# Patient Record
Sex: Female | Born: 1990 | Hispanic: Yes | Marital: Single | Smoking: Never smoker
Health system: Southern US, Community
[De-identification: ages and names within clinical notes are randomized; demographics above are authoritative.]

## PROBLEM LIST (undated history)

## (undated) ENCOUNTER — Inpatient Hospital Stay: Payer: Self-pay

## (undated) ENCOUNTER — Inpatient Hospital Stay (HOSPITAL_COMMUNITY): Payer: Self-pay

## (undated) DIAGNOSIS — D649 Anemia, unspecified: Secondary | ICD-10-CM

## (undated) DIAGNOSIS — Z789 Other specified health status: Secondary | ICD-10-CM

## (undated) HISTORY — DX: Anemia, unspecified: D64.9

## (undated) HISTORY — PX: NO PAST SURGERIES: SHX2092

---
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
## 2013-10-06 ENCOUNTER — Inpatient Hospital Stay: Payer: Self-pay | Admitting: Obstetrics and Gynecology

## 2013-10-06 LAB — CBC WITH DIFFERENTIAL/PLATELET
BASOS PCT: 0.2 %
Basophil #: 0 10*3/uL (ref 0.0–0.1)
Eosinophil #: 0 10*3/uL (ref 0.0–0.7)
Eosinophil %: 0.5 %
HCT: 29.1 % — AB (ref 35.0–47.0)
HGB: 8.7 g/dL — ABNORMAL LOW (ref 12.0–16.0)
LYMPHS PCT: 22.5 %
Lymphocyte #: 1.8 10*3/uL (ref 1.0–3.6)
MCH: 22.2 pg — ABNORMAL LOW (ref 26.0–34.0)
MCHC: 30.1 g/dL — ABNORMAL LOW (ref 32.0–36.0)
MCV: 74 fL — ABNORMAL LOW (ref 80–100)
MONO ABS: 0.6 x10 3/mm (ref 0.2–0.9)
MONOS PCT: 7.8 %
NEUTROS ABS: 5.7 10*3/uL (ref 1.4–6.5)
Neutrophil %: 69 %
PLATELETS: 227 10*3/uL (ref 150–440)
RBC: 3.94 10*6/uL (ref 3.80–5.20)
RDW: 17.9 % — ABNORMAL HIGH (ref 11.5–14.5)
WBC: 8.2 10*3/uL (ref 3.6–11.0)

## 2013-10-06 LAB — GC/CHLAMYDIA PROBE AMP

## 2013-11-14 ENCOUNTER — Encounter (HOSPITAL_COMMUNITY): Payer: Self-pay | Admitting: *Deleted

## 2014-05-23 NOTE — H&P (Signed)
L&D Evaluation:  History:  HPI Pt is a 24 yo G2P1001 at 40.[redacted] weeks GA with an EDC of 10/01/13 dated by a [id number] u/s who presents to L&D with reports of contractions. She reports +FM, denies lof. Her prenatal course is significant for late to care at 15.5 weeks, 36 pound weight gain, and anemia. She is A+, VI, RI, GBS positive, and recieved her TDaP 07/26/13.   Presents with contractions   Patient's Medical History No Chronic Illness   Patient's Surgical History none   Medications Pre Natal Vitamins  Iron   Allergies NKDA   Social History none   Family History Non-Contributory   ROS:  ROS All systems were reviewed.  HEENT, CNS, GI, GU, Respiratory, CV, Renal and Musculoskeletal systems were found to be normal.   Exam:  Vital Signs stable   General no apparent distress   Mental Status clear   Chest clear   Heart normal sinus rhythm   Abdomen gravid, tender with contractions   Back no CVAT   Pelvic no external lesions, 5/75/-2 upon admission   Mebranes Intact   FHT normal rate with no decels, 140's, +accels, no decels   Ucx regular, every 2-3 minutes   Skin dry, no lesions, no rashes   Lymph no lymphadenopathy   Impression:  Impression active labor, reactive NST, IUP at 3499w5d, GBS positive   Plan:  Plan EFM/NST, antibiotics for GBBS prophylaxis, labs, IV pain medication if desired, epidural when labs back, GBS abx, anticipate svd   Follow Up Appointment need to schedule. in 6 weeks   Electronic Signatures: Harnoor Kohles, AnimatorCourtney (CNM)  (Signed 24-Sep-15 09:26)  Authored: L&D Evaluation   Last Updated: 24-Sep-15 09:26 by Jannet MantisSubudhi, Hargis Vandyne (CNM)

## 2015-07-07 ENCOUNTER — Emergency Department (HOSPITAL_COMMUNITY)
Admission: EM | Admit: 2015-07-07 | Discharge: 2015-07-07 | Disposition: A | Discharge status: Home / Self Care | Payer: Self-pay | Attending: Emergency Medicine | Admitting: Emergency Medicine

## 2015-07-07 ENCOUNTER — Encounter (HOSPITAL_COMMUNITY): Payer: Self-pay | Admitting: Emergency Medicine

## 2015-07-07 DIAGNOSIS — T148XXA Other injury of unspecified body region, initial encounter: Secondary | ICD-10-CM

## 2015-07-07 DIAGNOSIS — S71111A Laceration without foreign body, right thigh, initial encounter: Secondary | ICD-10-CM | POA: Insufficient documentation

## 2015-07-07 DIAGNOSIS — Y999 Unspecified external cause status: Secondary | ICD-10-CM | POA: Insufficient documentation

## 2015-07-07 DIAGNOSIS — Y92512 Supermarket, store or market as the place of occurrence of the external cause: Secondary | ICD-10-CM | POA: Insufficient documentation

## 2015-07-07 DIAGNOSIS — Y9389 Activity, other specified: Secondary | ICD-10-CM | POA: Insufficient documentation

## 2015-07-07 DIAGNOSIS — W228XXA Striking against or struck by other objects, initial encounter: Secondary | ICD-10-CM | POA: Insufficient documentation

## 2015-07-07 NOTE — ED Notes (Signed)
Pt. Stated, I was trying on a dress and a piece of metal was sticking out and cut my upper rt. Leg.

## 2015-07-07 NOTE — ED Provider Notes (Signed)
CSN: [id number]     Arrival date & time 07/07/15  1129 History   First MD Initiated Contact with Patient 07/07/15 1237     Chief Complaint  Patient presents with  . Extremity Laceration    (Consider location/radiation/quality/duration/timing/severity/associated sxs/prior Treatment) The history is provided by the patient and medical records. No language interpreter was used.     Kimberly Salinas is a 25 y.o. female  who presents to the Emergency Department complaining of laceration to right upper leg causing mild 2/10 pain. [facility] and a piece of metal was stuck to it, cutting her leg. Last tetanus shot approx. 2 years ago. Denies any associated symptoms. Denies any prior treatment.    Past Medical History  Diagnosis Date  . No pertinent past medical history    Past Surgical History  Procedure Laterality Date  . No past surgeries     Family History  Problem Relation Age of Onset  . Anesthesia problems Neg Hx   . Hypotension Neg Hx   . Malignant hyperthermia Neg Hx   . Pseudochol deficiency Neg Hx   . Diabetes Paternal Aunt   . Diabetes Paternal Uncle    Social History  Substance Use Topics  . Smoking status: Never Smoker   . Smokeless tobacco: None  . Alcohol Use: No   OB History    Gravida Para Term Preterm AB TAB SAB Ectopic Multiple Living   1 1 1       1      Review of Systems  Constitutional: Negative for fever.  Musculoskeletal: Positive for myalgias. Negative for arthralgias.  Skin: Positive for wound.      Allergies  Review of patient's allergies indicates no known allergies.  Home Medications   Prior to Admission medications   Medication Sig Start Date End Date Taking? Authorizing Provider  ibuprofen (ADVIL,MOTRIN) 600 MG tablet Take 1 tablet (600 mg total) by mouth every 6 (six) hours. 04/14/11 04/24/11  Brock Badharles A Harper, MD   BP 112/69 mmHg  Pulse 85  Temp(Src) 97.9 F (36.6 C) (Oral)   Resp 18  SpO2 98%  LMP 06/30/2015 Physical Exam  Constitutional: She is oriented to person, place, and time. She appears well-developed and well-nourished.  Alert and in no acute distress  HENT:  Head: Normocephalic and atraumatic.  Cardiovascular: Normal rate, regular rhythm, normal heart sounds and intact distal pulses.   Pulmonary/Chest: Effort normal and breath sounds normal. No respiratory distress.  Musculoskeletal: She exhibits no edema.  Neurological: She is alert and oriented to person, place, and time.  Skin: Skin is warm and dry.  3.5 cm superficial laceration of right upper thigh with no surrounding erythema.   Psychiatric: She has a normal mood and affect.  Nursing note and vitals reviewed.   ED Course  Procedures (including critical care time)  LACERATION REPAIR Performed by: Chase PicketJaime Pilcher Cayleigh Paull Authorized by: Chase PicketJaime Pilcher Zenola Dezarn Consent: Verbal consent obtained. Risks and benefits: risks, benefits and alternatives were discussed Consent given by: patient Patient identity confirmed: provided demographic data Prepped and Draped in normal sterile fashion Wound explored Laceration Location: right thigh Laceration Length: 3.5 cm No Foreign Bodies seen or palpated Anesthesia: none Irrigation method: syringe Amount of cleaning: standard Skin closure: steri-strips Number: 6 Patient tolerance: Patient tolerated the procedure well with no immediate complications.  Labs Review Labs Reviewed - No data to display  Imaging Review No results found. I have personally reviewed and  evaluated these images and lab results as part of my medical decision-making.   EKG Interpretation None      MDM   Final diagnoses:  Superficial laceration   Kimberly Salinas presents to ED for superficial laceration of right thigh. Laceration repaired with steri-strips as dictated above. Patient counseled on home wound care. Follow up with PCP/urgent care PRN. Patient was urged to  return to the Emergency Department for worsening pain, swelling, expanding erythema especially if it streaks away from the affected area, fever, or for any additional concerns. Patient verbalized understanding. All questions answered.   [facility] CenterJaime Pilcher Baird Polinski, PA-C 07/07/15 1309  Nelva Nayobert Beaton, MD 07/08/15 [phone]

## 2015-07-07 NOTE — ED Notes (Signed)
PT ambulated with baseline gait; VSS; A&Ox3; no signs of distress; respirations even and unlabored; skin warm and dry; no questions upon discharge.  

## 2015-07-07 NOTE — Discharge Instructions (Signed)
Keep area covered with bandage, keep bandage dry, and do not submerge in water for 24 hours. Ice and elevate for additional pain relief and swelling. Alternate between ibuprofen and Tylenol for additional pain relief. Monitor area for signs of infection to include, but not limited to: increasing pain, spreading redness, drainage/pus, worsening swelling, or fevers. Return to emergency department for emergent changing or worsening symptoms.   WOUND CARE  Keep area clean and dry for 24 hours. Do not remove bandage, if applied.  After 24 hours,you should change it at least once a day. Also, change the dressing if it becomes wet or dirty, or as directed by your caregiver.   Return if you experience any of the following signs of infection: Swelling, redness, pus drainage, streaking, fever >101.0 F  Return if you experience excessive bleeding that does not stop after 15-20 minutes of constant, firm pressure.

## 2017-01-13 NOTE — L&D Delivery Note (Signed)
Delivery Note At  [phone] a viable and healthy female was delivered via  (Presentation:LOA ;  ).  APGAR:9 ,9  .   Placenta status:delivered intact with 3 vessel  Cord:  with the following complications: true knot in cord  Anesthesia:  epidural Episiotomy:  none Lacerations:  none Suture Repair: NA Est. Blood Loss (mL):    Mom to postpartum.  Baby to Couplet care / Skin to Skin.  Randol Zumstein N Reagyn Facemire 11/30/2017, 9:36 AM

## 2017-06-01 ENCOUNTER — Encounter: Payer: Self-pay | Admitting: Obstetrics and Gynecology

## 2017-06-05 ENCOUNTER — Encounter: Payer: Self-pay | Admitting: Certified Nurse Midwife

## 2017-06-05 ENCOUNTER — Other Ambulatory Visit (HOSPITAL_COMMUNITY)
Admission: RE | Admit: 2017-06-05 | Discharge: 2017-06-05 | Disposition: A | Discharge status: Home / Self Care | Payer: Medicaid Other | Source: Ambulatory Visit | Attending: Certified Nurse Midwife | Admitting: Certified Nurse Midwife

## 2017-06-05 ENCOUNTER — Ambulatory Visit (INDEPENDENT_AMBULATORY_CARE_PROVIDER_SITE_OTHER): Payer: Medicaid Other | Admitting: Certified Nurse Midwife

## 2017-06-05 VITALS — BP 129/75 | HR 97 | Wt 162.0 lb

## 2017-06-05 DIAGNOSIS — Z3482 Encounter for supervision of other normal pregnancy, second trimester: Secondary | ICD-10-CM

## 2017-06-05 DIAGNOSIS — Z3480 Encounter for supervision of other normal pregnancy, unspecified trimester: Secondary | ICD-10-CM | POA: Diagnosis not present

## 2017-06-05 MED ORDER — VITAFOL GUMMIES 3.33-0.333-34.8 MG PO CHEW: 3.0000 | CHEWABLE_TABLET | Freq: Every day | ORAL | 12 refills | Status: DC

## 2017-06-05 NOTE — Progress Notes (Signed)
Subjective:   Amiliah Seville Brick is a 27 y.o. [id number] at [redacted]w[redacted]d by LMP being seen today for her first obstetrical visit.  Her obstetrical history is significant for none. Patient does intend to pump for several weeks/ intend to breast feed. Pregnancy history fully reviewed.  Not currently employed.  Patient reports no complaints.  HISTORY: OB History  Gravida Para Term Preterm AB Living  0 0 2  SAB TAB Ectopic Multiple Live Births  0 0 0 0 2    # Outcome Date GA Lbr Len/2nd Weight Sex Delivery Anes PTL Lv  3 Current           2 Term 10/06/12    M Vag-Spont   LIV  1 Term 04/12/11 [redacted]w[redacted]d 28:45 / 03:00 8 lb 2.5 oz (3.7 kg) F Vag-Spont EPI  LIV     Name: Beaulac,GIRL Kiarah     Apgar1: 8  Apgar5: 9    Last pap smear was done unknown  Past Medical History:  Diagnosis Date  . No pertinent past medical history    Past Surgical History:  Procedure Laterality Date  . NO PAST SURGERIES     Family History  Problem Relation Age of Onset  . Diabetes Paternal Aunt   . Diabetes Paternal Uncle   . Anesthesia problems Neg Hx   . Hypotension Neg Hx   . Malignant hyperthermia Neg Hx   . Pseudochol deficiency Neg Hx    Social History   Tobacco Use  . Smoking status: Never Smoker  . Smokeless tobacco: Never Used  Substance Use Topics  . Alcohol use: No  . Drug use: No   No Known Allergies No current outpatient medications on file prior to visit.   No current facility-administered medications on file prior to visit.     Review of Systems Pertinent items noted in HPI and remainder of comprehensive ROS otherwise negative.  Exam   Vitals:   06/05/17 0917  BP: 129/75  Pulse: 97  Weight: 162 lb (73.5 kg)   Fetal Heart Rate (bpm): 160; doppler  Uterus:  Fundal Height: 14 cm  Pelvic Exam: Perineum: no hemorrhoids, normal perineum   Vulva: normal external genitalia, no lesions   Vagina:  normal mucosa, normal discharge   Cervix: no lesions and normal, pap smear  done, friable    Adnexa: normal adnexa and no mass, fullness, tenderness   Bony Pelvis: average  System: General: well-developed, well-nourished female in no acute distress   Breast:  normal appearance, no masses or tenderness   Skin: normal coloration and turgor, no rashes   Neurologic: oriented, normal, negative, normal mood   Extremities: normal strength, tone, and muscle mass, ROM of all joints is normal   HEENT PERRLA, extraocular movement intact and sclera clear, anicteric   Mouth/Teeth mucous membranes moist, pharynx normal without lesions and dental hygiene good   Neck supple and no masses   Cardiovascular: regular rate and rhythm   Respiratory:  no respiratory distress, normal breath sounds   Abdomen: soft, non-tender; bowel sounds normal; no masses,  no organomegaly     Assessment:   Pregnancy: A5W0981 Patient Active Problem List   Diagnosis Date Noted  . Supervision of other normal pregnancy, antepartum 06/05/2017     Plan:  1. Supervision of other normal pregnancy, antepartum     - Cytology - PAP - VITAMIN D 25 Hydroxy (Vit-D Deficiency, Fractures) - Culture, OB Urine - Obstetric Panel, Including HIV - Hemoglobin  A1c - Hemoglobinopathy evaluation - Inheritest Core(CF97,SMA,FraX) - Cervicovaginal ancillary only - Prenatal Vit-Fe Phos-FA-Omega (VITAFOL GUMMIES) 3.33-0.333-34.8 MG CHEW; Chew 3 tablets by mouth at bedtime.  Dispense: 90 tablet; Refill: 12 - [place] MFM OB COMP + 14 WK; Future   Initial labs drawn. Continue prenatal vitamins. Genetic Screening discussed, NIPS: ordered. Ultrasound discussed; fetal anatomic survey: ordered. Problem list reviewed and updated. The nature of [facility] with multiple MDs and other Advanced Practice Providers was explained to patient; also emphasized that residents, students are part of our team. Routine obstetric precautions reviewed. Return in about 1 month (around 07/03/2017) for  ROB.     Orvilla Cornwall, CNM [facility], [facility]

## 2017-06-05 NOTE — Patient Instructions (Signed)
Second Trimester of Pregnancy The second trimester is from week 13 through week 28, month 4 through 6. This is often the time in pregnancy that you feel your best. Often times, morning sickness has lessened or quit. You may have more energy, and you may get hungry more often. Your unborn baby (fetus) is growing rapidly. At the end of the sixth month, he or she is about 9 inches long and weighs about 1 pounds. You will likely feel the baby move (quickening) between 18 and 20 weeks of pregnancy. Follow these instructions at home:  Avoid all smoking, herbs, and alcohol. Avoid drugs not approved by your doctor.  Do not use any tobacco products, including cigarettes, chewing tobacco, and electronic cigarettes. If you need help quitting, ask your doctor. You may get counseling or other support to help you quit.  Only take medicine as told by your doctor. Some medicines are safe and some are not during pregnancy.  Exercise only as told by your doctor. Stop exercising if you start having cramps.  Eat regular, healthy meals.  Wear a good support bra if your breasts are tender.  Do not use hot tubs, steam rooms, or saunas.  Wear your seat belt when driving.  Avoid raw meat, uncooked cheese, and liter boxes and soil used by cats.  Take your prenatal vitamins.  Take 1500-2000 milligrams of calcium daily starting at the 20th week of pregnancy until you deliver your baby.  Try taking medicine that helps you poop (stool softener) as needed, and if your doctor approves. Eat more fiber by eating fresh fruit, vegetables, and whole grains. Drink enough fluids to keep your pee (urine) clear or pale yellow.  Take warm water baths (sitz baths) to soothe pain or discomfort caused by hemorrhoids. Use hemorrhoid cream if your doctor approves.  If you have puffy, bulging veins (varicose veins), wear support hose. Raise (elevate) your feet for 15 minutes, 3-4 times a day. Limit salt in your diet.  Avoid heavy  lifting, wear low heals, and sit up straight.  Rest with your legs raised if you have leg cramps or low back pain.  Visit your dentist if you have not gone during your pregnancy. Use a soft toothbrush to brush your teeth. Be gentle when you floss.  You can have sex (intercourse) unless your doctor tells you not to.  Go to your doctor visits. Get help if:  You feel dizzy.  You have mild cramps or pressure in your lower belly (abdomen).  You have a nagging pain in your belly area.  You continue to feel sick to your stomach (nauseous), throw up (vomit), or have watery poop (diarrhea).  You have bad smelling fluid coming from your vagina.  You have pain with peeing (urination). Get help right away if:  You have a fever.  You are leaking fluid from your vagina.  You have spotting or bleeding from your vagina.  You have severe belly cramping or pain.  You lose or gain weight rapidly.  You have trouble catching your breath and have chest pain.  You notice sudden or extreme puffiness (swelling) of your face, hands, ankles, feet, or legs.  You have not felt the baby move in over an hour.  You have severe headaches that do not go away with medicine.  You have vision changes. This information is not intended to replace advice given to you by your health care provider. Make sure you discuss any questions you have with your health care   provider. Document Released: 03/26/2009 Document Revised: 06/07/2015 Document Reviewed: 03/02/2012 Elsevier Interactive Patient Education  2017 Elsevier Inc.  

## 2017-06-09 ENCOUNTER — Other Ambulatory Visit: Payer: Self-pay | Admitting: Certified Nurse Midwife

## 2017-06-09 DIAGNOSIS — O2342 Unspecified infection of urinary tract in pregnancy, second trimester: Secondary | ICD-10-CM

## 2017-06-09 LAB — CULTURE, OB URINE

## 2017-06-09 LAB — CERVICOVAGINAL ANCILLARY ONLY
CHLAMYDIA, DNA PROBE: NEGATIVE
NEISSERIA GONORRHEA: NEGATIVE

## 2017-06-09 LAB — URINE CULTURE, OB REFLEX

## 2017-06-09 MED ORDER — CEFIXIME 400 MG PO CAPS: 400.0000 mg | ORAL_CAPSULE | Freq: Every day | ORAL | 0 refills | Status: DC

## 2017-06-10 LAB — OBSTETRIC PANEL, INCLUDING HIV
ANTIBODY SCREEN: NEGATIVE
BASOS: 0 %
Basophils Absolute: 0 10*3/uL (ref 0.0–0.2)
EOS (ABSOLUTE): 0.1 10*3/uL (ref 0.0–0.4)
EOS: 1 %
HEMATOCRIT: 34.8 % (ref 34.0–46.6)
HEMOGLOBIN: 11.7 g/dL (ref 11.1–15.9)
HIV SCREEN 4TH GENERATION: NONREACTIVE
Hepatitis B Surface Ag: NEGATIVE
Immature Grans (Abs): 0 10*3/uL (ref 0.0–0.1)
Immature Granulocytes: 0 %
LYMPHS ABS: 1.3 10*3/uL (ref 0.7–3.1)
LYMPHS: 20 %
MCH: 29 pg (ref 26.6–33.0)
MCHC: 33.6 g/dL (ref 31.5–35.7)
MCV: 86 fL (ref 79–97)
MONOS ABS: 0.4 10*3/uL (ref 0.1–0.9)
Monocytes: 5 %
Neutrophils Absolute: 4.9 10*3/uL (ref 1.4–7.0)
Neutrophils: 74 %
Platelets: 221 10*3/uL (ref 150–450)
RBC: 4.04 x10E6/uL (ref 3.77–5.28)
RDW: 15.3 % (ref 12.3–15.4)
RH TYPE: POSITIVE
RPR Ser Ql: NONREACTIVE
Rubella Antibodies, IGG: 2.02 index (ref >0.99)
WBC: 6.7 10*3/uL (ref 3.4–10.8)

## 2017-06-10 LAB — HEMOGLOBINOPATHY EVALUATION
HEMOGLOBIN A2 QUANTITATION: 2.1 % (ref 1.8–3.2)
HGB C: 0 %
HGB S: 0 %
HGB VARIANT: 0 %
Hemoglobin F Quantitation: 0 % (ref 0.0–2.0)
Hgb A: 97.9 % (ref 96.4–98.8)

## 2017-06-10 LAB — VITAMIN D 25 HYDROXY (VIT D DEFICIENCY, FRACTURES): Vit D, 25-Hydroxy: 10.9 ng/mL — ABNORMAL LOW (ref 30.0–100.0)

## 2017-06-10 LAB — HEMOGLOBIN A1C
Est. average glucose Bld gHb Est-mCnc: 111 mg/dL
HEMOGLOBIN A1C: 5.5 % (ref 4.8–5.6)

## 2017-06-10 LAB — CYTOLOGY - PAP: Diagnosis: NEGATIVE

## 2017-06-11 ENCOUNTER — Other Ambulatory Visit: Payer: Self-pay | Admitting: Certified Nurse Midwife

## 2017-06-11 DIAGNOSIS — Z3480 Encounter for supervision of other normal pregnancy, unspecified trimester: Secondary | ICD-10-CM

## 2017-06-11 DIAGNOSIS — E559 Vitamin D deficiency, unspecified: Secondary | ICD-10-CM

## 2017-06-11 MED ORDER — VITAMIN D (ERGOCALCIFEROL) 1.25 MG (50000 UNIT) PO CAPS: 50000.0000 [IU] | ORAL_CAPSULE | ORAL | 2 refills | Status: DC

## 2017-06-17 LAB — INHERITEST CORE(CF97,SMA,FRAX)

## 2017-06-18 ENCOUNTER — Encounter: Payer: Self-pay | Admitting: *Deleted

## 2017-06-19 ENCOUNTER — Other Ambulatory Visit: Payer: Self-pay | Admitting: Certified Nurse Midwife

## 2017-06-19 ENCOUNTER — Encounter: Payer: Self-pay | Admitting: *Deleted

## 2017-06-19 DIAGNOSIS — Z3480 Encounter for supervision of other normal pregnancy, unspecified trimester: Secondary | ICD-10-CM

## 2017-06-22 ENCOUNTER — Encounter (HOSPITAL_COMMUNITY): Payer: Self-pay

## 2017-06-29 ENCOUNTER — Ambulatory Visit (HOSPITAL_COMMUNITY)
Admission: RE | Admit: 2017-06-29 | Discharge: 2017-06-29 | Disposition: A | Discharge status: Home / Self Care | Payer: Medicaid Other | Source: Ambulatory Visit | Attending: Certified Nurse Midwife | Admitting: Certified Nurse Midwife

## 2017-06-29 DIAGNOSIS — Z3482 Encounter for supervision of other normal pregnancy, second trimester: Secondary | ICD-10-CM | POA: Diagnosis not present

## 2017-06-29 DIAGNOSIS — Z3A18 18 weeks gestation of pregnancy: Secondary | ICD-10-CM | POA: Insufficient documentation

## 2017-06-29 DIAGNOSIS — Z3480 Encounter for supervision of other normal pregnancy, unspecified trimester: Secondary | ICD-10-CM

## 2017-07-03 ENCOUNTER — Ambulatory Visit (INDEPENDENT_AMBULATORY_CARE_PROVIDER_SITE_OTHER): Payer: Medicaid Other | Admitting: Certified Nurse Midwife

## 2017-07-03 ENCOUNTER — Other Ambulatory Visit: Payer: Self-pay | Admitting: Certified Nurse Midwife

## 2017-07-03 VITALS — BP 107/65 | HR 76 | Wt 167.0 lb

## 2017-07-03 DIAGNOSIS — Z3482 Encounter for supervision of other normal pregnancy, second trimester: Secondary | ICD-10-CM

## 2017-07-03 DIAGNOSIS — Z3480 Encounter for supervision of other normal pregnancy, unspecified trimester: Secondary | ICD-10-CM

## 2017-07-03 DIAGNOSIS — E559 Vitamin D deficiency, unspecified: Secondary | ICD-10-CM

## 2017-07-03 DIAGNOSIS — O2342 Unspecified infection of urinary tract in pregnancy, second trimester: Secondary | ICD-10-CM

## 2017-07-03 NOTE — Progress Notes (Signed)
   PRENATAL VISIT NOTE  Subjective:  Kimberly Salinas is a 27 y.o. [id number] at 1848w5d being seen today for ongoing prenatal care.  She is currently monitored for the following issues for this low-risk pregnancy and has Supervision of other normal pregnancy, antepartum; UTI (urinary tract infection) during pregnancy, second trimester; and Vitamin D deficiency on their problem list.  Patient reports no complaints.  Contractions: Not present. Vag. Bleeding: None.  Movement: Present. Denies leaking of fluid.   The following portions of the patient's history were reviewed and updated as appropriate: allergies, current medications, past family history, past medical history, past social history, past surgical history and problem list. Problem list updated.  Objective:   Vitals:   07/03/17 0923  BP: 107/65  Pulse: 76  Weight: 167 lb (75.8 kg)    Fetal Status: Fetal Heart Rate (bpm): 154; US   Movement: Present     General:  Alert, oriented and cooperative. Patient is in no acute distress.  Skin: Skin is warm and dry. No rash noted.   Cardiovascular: Normal heart rate noted  Respiratory: Normal respiratory effort, no problems with respiration noted  Abdomen: Soft, gravid, appropriate for gestational age.  Pain/Pressure: Absent     Pelvic: Cervical exam deferred        Extremities: Normal range of motion.     Mental Status: Normal mood and affect. Normal behavior. Normal judgment and thought content.   Assessment and Plan:  Pregnancy: G3P2002 at 5748w5d  1. Supervision of other normal pregnancy, antepartum     Doing well.  F/U US ordered.  - AFP, Serum, Open Spina Bifida  2. Vitamin D deficiency     Taking weekly vitamin D  3. UTI (urinary tract infection) during pregnancy, second trimester     TOC next ROB  Preterm labor symptoms and general obstetric precautions including but not limited to vaginal bleeding, contractions, leaking of fluid and fetal movement were reviewed in detail  with the patient. Please refer to After Visit Summary for other counseling recommendations.  Return in about 1 month (around 07/31/2017) for ROB.  Future Appointments  Date Time Provider Department Center  08/04/2017  1:00 PM Roe CoombsDenney, Rachelle A, CNM [facility] None  08/17/2017 10:15 AM [facility] US 4 [facility]    Roe Coombsachelle A Denney, CNM

## 2017-07-08 ENCOUNTER — Other Ambulatory Visit: Payer: Self-pay | Admitting: Certified Nurse Midwife

## 2017-07-08 DIAGNOSIS — Z3480 Encounter for supervision of other normal pregnancy, unspecified trimester: Secondary | ICD-10-CM

## 2017-07-08 LAB — AFP, SERUM, OPEN SPINA BIFIDA
AFP MoM: 2.26
AFP Value: 97.9 ng/mL
Gest. Age on Collection Date: 18.7 weeks
Maternal Age At EDD: 25.7 yr
OSBR Risk 1 IN: 462
Test Results:: NEGATIVE
WEIGHT: 167 [lb_av]

## 2017-08-04 ENCOUNTER — Encounter (HOSPITAL_COMMUNITY): Payer: Self-pay | Admitting: *Deleted

## 2017-08-04 ENCOUNTER — Encounter: Payer: Self-pay | Admitting: Obstetrics

## 2017-08-04 ENCOUNTER — Ambulatory Visit (INDEPENDENT_AMBULATORY_CARE_PROVIDER_SITE_OTHER): Payer: Medicaid Other | Admitting: Obstetrics

## 2017-08-04 ENCOUNTER — Inpatient Hospital Stay (HOSPITAL_COMMUNITY)
Admission: AD | Admit: 2017-08-04 | Discharge: 2017-08-04 | Disposition: A | Discharge status: Home / Self Care | Payer: Medicaid Other | Source: Ambulatory Visit | Attending: Obstetrics and Gynecology | Admitting: Obstetrics and Gynecology

## 2017-08-04 ENCOUNTER — Other Ambulatory Visit: Payer: Self-pay

## 2017-08-04 DIAGNOSIS — T1490XA Injury, unspecified, initial encounter: Secondary | ICD-10-CM

## 2017-08-04 DIAGNOSIS — Z041 Encounter for examination and observation following transport accident: Secondary | ICD-10-CM | POA: Diagnosis not present

## 2017-08-04 DIAGNOSIS — Z3480 Encounter for supervision of other normal pregnancy, unspecified trimester: Secondary | ICD-10-CM

## 2017-08-04 DIAGNOSIS — Z3A23 23 weeks gestation of pregnancy: Secondary | ICD-10-CM

## 2017-08-04 DIAGNOSIS — Z79899 Other long term (current) drug therapy: Secondary | ICD-10-CM | POA: Insufficient documentation

## 2017-08-04 DIAGNOSIS — Z3482 Encounter for supervision of other normal pregnancy, second trimester: Secondary | ICD-10-CM

## 2017-08-04 DIAGNOSIS — O9A212 Injury, poisoning and certain other consequences of external causes complicating pregnancy, second trimester: Secondary | ICD-10-CM | POA: Diagnosis not present

## 2017-08-04 DIAGNOSIS — Z3689 Encounter for other specified antenatal screening: Secondary | ICD-10-CM
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
## 2017-08-04 NOTE — MAU Note (Signed)
Urine sent to lab 

## 2017-08-04 NOTE — Progress Notes (Signed)
Subjective:  Kimberly Salinas is a 27 y.o. G3P2002 at 7245w2d being seen today for ongoing prenatal care.  She is currently monitored for the following issues for this low-risk pregnancy and has Supervision of other normal pregnancy, antepartum; UTI (urinary tract infection) during pregnancy, second trimester; and Vitamin D deficiency on their problem list.  Patient reports no complaints.  Contractions: Not present. Vag. Bleeding: None.  Movement: Present. Denies leaking of fluid.   The following portions of the patient's history were reviewed and updated as appropriate: allergies, current medications, past family history, past medical history, past social history, past surgical history and problem list. Problem list updated.  Objective:   Vitals:   08/04/17 1317  BP: 101/61  Pulse: (!) 101  Weight: 164 lb 8 oz (74.6 kg)    Fetal Status: Fetal Heart Rate (bpm): 150   Movement: Present     General:  Alert, oriented and cooperative. Patient is in no acute distress.  Skin: Skin is warm and dry. No rash noted.   Cardiovascular: Normal heart rate noted  Respiratory: Normal respiratory effort, no problems with respiration noted  Abdomen: Soft, gravid, appropriate for gestational age. Pain/Pressure: Absent     Pelvic:  Cervical exam deferred        Extremities: Normal range of motion.  Edema: None  Mental Status: Normal mood and affect. Normal behavior. Normal judgment and thought content.   Urinalysis:      Assessment and Plan:  Pregnancy: G3P2002 at 3445w2d  1. Supervision of other normal pregnancy, antepartum   Preterm labor symptoms and general obstetric precautions including but not limited to vaginal bleeding, contractions, leaking of fluid and fetal movement were reviewed in detail with the patient. Please refer to After Visit Summary for other counseling recommendations.  Return in about 1 month (around 09/01/2017) for ROB, 2 hour OGTT.   Brock BadHarper, Charles A, MD

## 2017-08-04 NOTE — Progress Notes (Signed)
Patient reports good fetal movement, denies pain. 
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
## 2017-08-04 NOTE — Discharge Instructions (Signed)
Second Trimester of Pregnancy The second trimester is from week 13 through week 28, month 4 through 6. This is often the time in pregnancy that you feel your best. Often times, morning sickness has lessened or quit. You may have more energy, and you may get hungry more often. Your unborn baby (fetus) is growing rapidly. At the end of the sixth month, he or she is about 9 inches long and weighs about 1 pounds. You will likely feel the baby move (quickening) between 18 and 20 weeks of pregnancy. Follow these instructions at home:  Avoid all smoking, herbs, and alcohol. Avoid drugs not approved by your doctor.  Do not use any tobacco products, including cigarettes, chewing tobacco, and electronic cigarettes. If you need help quitting, ask your doctor. You may get counseling or other support to help you quit.  Only take medicine as told by your doctor. Some medicines are safe and some are not during pregnancy.  Exercise only as told by your doctor. Stop exercising if you start having cramps.  Eat regular, healthy meals.  Wear a good support bra if your breasts are tender.  Do not use hot tubs, steam rooms, or saunas.  Wear your seat belt when driving.  Avoid raw meat, uncooked cheese, and liter boxes and soil used by cats.  Take your prenatal vitamins.  Take 1500-2000 milligrams of calcium daily starting at the 20th week of pregnancy until you deliver your baby.  Try taking medicine that helps you poop (stool softener) as needed, and if your doctor approves. Eat more fiber by eating fresh fruit, vegetables, and whole grains. Drink enough fluids to keep your pee (urine) clear or pale yellow.  Take warm water baths (sitz baths) to soothe pain or discomfort caused by hemorrhoids. Use hemorrhoid cream if your doctor approves.  If you have puffy, bulging veins (varicose veins), wear support hose. Raise (elevate) your feet for 15 minutes, 3-4 times a day. Limit salt in your diet.  Avoid heavy  lifting, wear low heals, and sit up straight.  Rest with your legs raised if you have leg cramps or low back pain.  Visit your dentist if you have not gone during your pregnancy. Use a soft toothbrush to brush your teeth. Be gentle when you floss.  You can have sex (intercourse) unless your doctor tells you not to.  Go to your doctor visits. Get help if:  You feel dizzy.  You have mild cramps or pressure in your lower belly (abdomen).  You have a nagging pain in your belly area.  You continue to feel sick to your stomach (nauseous), throw up (vomit), or have watery poop (diarrhea).  You have bad smelling fluid coming from your vagina.  You have pain with peeing (urination). Get help right away if:  You have a fever.  You are leaking fluid from your vagina.  You have spotting or bleeding from your vagina.  You have severe belly cramping or pain.  You lose or gain weight rapidly.  You have trouble catching your breath and have chest pain.  You notice sudden or extreme puffiness (swelling) of your face, hands, ankles, feet, or legs.  You have not felt the baby move in over an hour.  You have severe headaches that do not go away with medicine.  You have vision changes. This information is not intended to replace advice given to you by your health care provider. Make sure you discuss any questions you have with your health care   provider. Document Released: 03/26/2009 Document Revised: 06/07/2015 Document Reviewed: 03/02/2012 Elsevier Interactive Patient Education  2017 Elsevier Inc.  

## 2017-08-06 ENCOUNTER — Other Ambulatory Visit: Payer: Self-pay | Admitting: Obstetrics

## 2017-08-06 ENCOUNTER — Telehealth: Payer: Self-pay

## 2017-08-06 DIAGNOSIS — Z3490 Encounter for supervision of normal pregnancy, unspecified, unspecified trimester: Secondary | ICD-10-CM

## 2017-08-06 MED ORDER — PNV-DHA+DOCUSATE 27-1.25-300 MG PO CAPS
1.0000 | ORAL_CAPSULE | Freq: Every day | ORAL | 4 refills | Status: DC
Start: 2017-08-06 — End: 2017-10-06
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
## 2017-08-06 NOTE — Telephone Encounter (Signed)
Pt requesting new PNV Rachelle recently sent PNV Gummies they are not cost effectives Please advise free PNV for pt to [facility] [place] on [address]

## 2017-08-06 NOTE — Telephone Encounter (Signed)
PNV's Rx 

## 2017-08-10 ENCOUNTER — Other Ambulatory Visit: Payer: Self-pay

## 2017-08-10 ENCOUNTER — Telehealth: Payer: Self-pay

## 2017-08-10 MED ORDER — PRENATAL GUMMIES/DHA & FA 0.4-32.5 MG PO CHEW: 1.0000 | CHEWABLE_TABLET | Freq: Once | ORAL | 12 refills | Status: AC

## 2017-08-10 NOTE — Telephone Encounter (Signed)
Returned call and pt stated that last rx for PN gummies is not covered by her insurance, advised pt that provider would send another one.

## 2017-08-17 ENCOUNTER — Other Ambulatory Visit: Payer: Self-pay | Admitting: Certified Nurse Midwife

## 2017-08-17 ENCOUNTER — Ambulatory Visit (HOSPITAL_COMMUNITY)
Admission: RE | Admit: 2017-08-17 | Discharge: 2017-08-17 | Disposition: A | Discharge status: Home / Self Care | Payer: Medicaid Other | Source: Ambulatory Visit | Attending: Internal Medicine | Admitting: Internal Medicine

## 2017-08-17 ENCOUNTER — Other Ambulatory Visit: Payer: Self-pay | Admitting: Obstetrics

## 2017-08-17 DIAGNOSIS — Z362 Encounter for other antenatal screening follow-up: Secondary | ICD-10-CM | POA: Insufficient documentation

## 2017-08-17 DIAGNOSIS — Z3A25 25 weeks gestation of pregnancy: Secondary | ICD-10-CM

## 2017-08-17 DIAGNOSIS — Z3480 Encounter for supervision of other normal pregnancy, unspecified trimester: Secondary | ICD-10-CM

## 2017-08-17 DIAGNOSIS — Z3482 Encounter for supervision of other normal pregnancy, second trimester: Secondary | ICD-10-CM | POA: Diagnosis not present

## 2017-09-04 ENCOUNTER — Ambulatory Visit (INDEPENDENT_AMBULATORY_CARE_PROVIDER_SITE_OTHER): Payer: Medicaid Other | Admitting: Student

## 2017-09-04 ENCOUNTER — Encounter: Payer: Self-pay | Admitting: Obstetrics and Gynecology

## 2017-09-04 VITALS — BP 103/64 | HR 80 | Wt 169.6 lb

## 2017-09-04 DIAGNOSIS — Z3480 Encounter for supervision of other normal pregnancy, unspecified trimester: Secondary | ICD-10-CM

## 2017-09-04 MED ORDER — PREPLUS 27-1 MG PO TABS: 1.0000 | ORAL_TABLET | Freq: Every day | ORAL | 13 refills | Status: DC

## 2017-09-04 NOTE — Progress Notes (Signed)
Patient ID: Kimberly Salinas, female   DOB: 1990/12/29, 27 y.o.   MRN: [id number]   PRENATAL VISIT NOTE  Subjective:  Kimberly Salinas is a 27 y.o. G3P2002 at 5568w5d being seen today for ongoing prenatal care.  She is currently monitored for the following issues for this low-risk pregnancy and has Supervision of other normal pregnancy, antepartum; UTI (urinary tract infection) during pregnancy, second trimester; and Vitamin D deficiency on their problem list.  Patient reports no complaints.  Contractions: Not present. Vag. Bleeding: None.  Movement: Present. Denies leaking of fluid.   The following portions of the patient's history were reviewed and updated as appropriate: allergies, current medications, past family history, past medical history, past social history, past surgical history and problem list. Problem list updated.  Objective:   Vitals:   09/04/17 0857  BP: 103/64  Pulse: 80  Weight: 169 lb 9.6 oz (76.9 kg)    Fetal Status: Fetal Heart Rate (bpm): 138 Fundal Height: 27 cm Movement: Present     General:  Alert, oriented and cooperative. Patient is in no acute distress.  Skin: Skin is warm and dry. No rash noted.   Cardiovascular: Normal heart rate noted  Respiratory: Normal respiratory effort, no problems with respiration noted  Abdomen: Soft, gravid, appropriate for gestational age.  Pain/Pressure: Absent     Pelvic: Cervical exam deferred        Extremities: Normal range of motion.  Edema: Trace  Mental Status: Normal mood and affect. Normal behavior. Normal judgment and thought content.   Assessment and Plan:  Pregnancy: G3P2002 at 5768w5d  1. Supervision of other normal pregnancy, antepartum Doing well; no complaints. Discussed postplacental IUD. 2 hour GTT not being done today (patient ate) so will reschedule for next week. Will also do UTI TOC next week as well.  - Prenatal Vit-Fe Fumarate-FA (PREPLUS) 27-1 MG TABS; Take 1 tablet by mouth daily.  Dispense:  30 tablet; Refill: 13 - Culture, OB Urine; Future - Glucose Tolerance, 2 Hours w/1 Hour; Future  Preterm labor symptoms and general obstetric precautions including but not limited to vaginal bleeding, contractions, leaking of fluid and fetal movement were reviewed in detail with the patient. Please refer to After Visit Summary for other counseling recommendations.  Return in about 2 weeks (around 09/18/2017), or LROB and next week for 2 hour gtt.  Future Appointments  Date Time Provider Department Center  09/09/2017  8:30 AM [facility] None  09/21/2017 11:10 AM Rolm BookbinderNeill, Caroline M, CNM [facility] None    Charlesetta GaribaldiKathryn Lorraine [place], [place]

## 2017-09-04 NOTE — Progress Notes (Signed)
Pt is G3P2 553w5d here for ROB visit.

## 2017-09-04 NOTE — Patient Instructions (Signed)

## 2017-09-09 ENCOUNTER — Other Ambulatory Visit: Payer: Medicaid Other

## 2017-09-09 DIAGNOSIS — Z3480 Encounter for supervision of other normal pregnancy, unspecified trimester: Secondary | ICD-10-CM

## 2017-09-10 LAB — CBC
Hematocrit: 26.9 % — ABNORMAL LOW (ref 34.0–46.6)
Hemoglobin: 8.6 g/dL — ABNORMAL LOW (ref 11.1–15.9)
MCH: 26.2 pg — ABNORMAL LOW (ref 26.6–33.0)
MCHC: 32 g/dL (ref 31.5–35.7)
MCV: 82 fL (ref 79–97)
PLATELETS: 224 10*3/uL (ref 150–450)
RBC: 3.28 x10E6/uL — ABNORMAL LOW (ref 3.77–5.28)
RDW: 13.8 % (ref 12.3–15.4)
WBC: 7.4 10*3/uL (ref 3.4–10.8)

## 2017-09-10 LAB — GLUCOSE TOLERANCE, 2 HOURS W/ 1HR
GLUCOSE, 1 HOUR: 127 mg/dL (ref 65–179)
GLUCOSE, 2 HOUR: 91 mg/dL (ref 65–152)
GLUCOSE, FASTING: 85 mg/dL (ref 65–91)

## 2017-09-10 LAB — HIV ANTIBODY (ROUTINE TESTING W REFLEX): HIV SCREEN 4TH GENERATION: NONREACTIVE

## 2017-09-10 LAB — RPR: RPR: NONREACTIVE

## 2017-09-11 ENCOUNTER — Other Ambulatory Visit: Payer: Self-pay | Admitting: Student

## 2017-09-11 DIAGNOSIS — O99013 Anemia complicating pregnancy, third trimester: Secondary | ICD-10-CM

## 2017-09-11 DIAGNOSIS — O99019 Anemia complicating pregnancy, unspecified trimester: Secondary | ICD-10-CM | POA: Insufficient documentation

## 2017-09-11 MED ORDER — FERUMOXYTOL INJECTION 510 MG/17 ML: 510.0000 mg | Freq: Once | INTRAVENOUS | 2 refills | Status: DC

## 2017-09-21 ENCOUNTER — Encounter: Payer: Self-pay | Admitting: Certified Nurse Midwife

## 2017-09-21 ENCOUNTER — Ambulatory Visit (INDEPENDENT_AMBULATORY_CARE_PROVIDER_SITE_OTHER): Payer: Medicaid Other | Admitting: Certified Nurse Midwife

## 2017-09-21 VITALS — BP 102/47 | HR 75 | Wt 173.4 lb

## 2017-09-21 DIAGNOSIS — Z3A30 30 weeks gestation of pregnancy: Secondary | ICD-10-CM

## 2017-09-21 DIAGNOSIS — Z3483 Encounter for supervision of other normal pregnancy, third trimester: Secondary | ICD-10-CM

## 2017-09-21 DIAGNOSIS — Z23 Encounter for immunization: Secondary | ICD-10-CM

## 2017-09-21 LAB — POCT URINALYSIS DIPSTICK OB
Bilirubin, UA: NEGATIVE
Glucose, UA: NEGATIVE
KETONES UA: NEGATIVE
LEUKOCYTES UA: NEGATIVE
NITRITE UA: NEGATIVE
PH UA: 6.5 (ref 5.0–8.0)
RBC UA: NEGATIVE
SPEC GRAV UA: 1.015 (ref 1.010–1.025)
UROBILINOGEN UA: 0.2 U/dL

## 2017-09-21 MED ORDER — TETANUS-DIPHTH-ACELL PERTUSSIS 5-2.5-18.5 LF-MCG/0.5 IM SUSP
0.5000 mL | Freq: Once | INTRAMUSCULAR | Status: AC
  Administered 2017-09-21: 0.5 mL via INTRAMUSCULAR

## 2017-09-21 NOTE — Progress Notes (Signed)
TRANSFER IN OB HISTORY AND PHYSICAL  SUBJECTIVE:       Kimberly Salinas is a 27 y.o. [id number] female, Patient's last menstrual period was 02/22/2017., Estimated Date of Delivery: 11/29/17, [redacted]w[redacted]d, presents today for Transition of Prenatal Care. EPIC data migration from outside records is accomplished today. Complaints today include: none      Gynecologic History Patient's last menstrual period was 02/22/2017. Normal Contraception: none Last Pap: 06/05/17. Results were: normal  Obstetric History OB History  Gravida Para Term Preterm AB Living  3 2 2     2   SAB TAB Ectopic Multiple Live Births          2    # Outcome Date GA Lbr Len/2nd Weight Sex Delivery Anes PTL Lv  3 Current           2 Term 10/06/12    M Vag-Spont   LIV  1 Term 04/12/11 [redacted]w[redacted]d 28:45 / 03:00 3.7 kg F Vag-Spont EPI  LIV    Past Medical History:  Diagnosis Date  . No pertinent past medical history     Past Surgical History:  Procedure Laterality Date  . NO PAST SURGERIES      Current Outpatient Medications on File Prior to Visit  Medication Sig Dispense Refill  . ferumoxytol (FERAHEME) 510 MG/17ML SOLN injection Inject 17 mLs (510 mg total) into the vein once for 1 dose. Patient needs Feraheme infusion weekly for three weeks. 17 mL 2  . Prenat-FeFum-DSS-FA-DHA w/o A (PNV-DHA+DOCUSATE) 27-1.25-300 MG CAPS Take 1 capsule by mouth daily before breakfast. 90 capsule 4  . Prenatal Vit-Fe Fumarate-FA (PREPLUS) 27-1 MG TABS Take 1 tablet by mouth daily. 30 tablet 13  . Prenatal Vit-Fe Phos-FA-Omega (VITAFOL GUMMIES) 3.33-0.333-34.8 MG CHEW Chew 3 tablets by mouth at bedtime. 90 tablet 12  . Vitamin D, Ergocalciferol, (DRISDOL) 50000 units CAPS capsule Take 1 capsule (50,000 Units total) by mouth every 7 (seven) days. 30 capsule 2   No current facility-administered medications on file prior to visit.     No Known Allergies  Social History   Socioeconomic History  . Marital status: Single    Spouse name:  Not on file  . Number of children: Not on file  . Years of education: Not on file  . Highest education level: Not on file  Occupational History  . Not on file  Social Needs  . Financial resource strain: Not on file  . Food insecurity:    Worry: Not on file    Inability: Not on file  . Transportation needs:    Medical: Not on file    Non-medical: Not on file  Tobacco Use  . Smoking status: Never Smoker  . Smokeless tobacco: Never Used  Substance and Sexual Activity  . Alcohol use: No  . Drug use: No  . Sexual activity: Yes  Lifestyle  . Physical activity:    Days per week: Not on file    Minutes per session: Not on file  . Stress: Not on file  Relationships  . Social connections:    Talks on phone: Not on file    Gets together: Not on file    Attends religious service: Not on file    Active member of club or organization: Not on file    Attends meetings of clubs or organizations: Not on file    Relationship status: Not on file  . Intimate partner violence:    Fear of current or ex partner: Not on file  Emotionally abused: Not on file    Physically abused: Not on file    Forced sexual activity: Not on file  Other Topics Concern  . Not on file  Social History Narrative  . Not on file    Family History  Problem Relation Age of Onset  . Diabetes Paternal Aunt   . Diabetes Paternal Uncle   . Anesthesia problems Neg Hx   . Hypotension Neg Hx   . Malignant hyperthermia Neg Hx   . Pseudochol deficiency Neg Hx     The following portions of the patient's history were reviewed and updated as appropriate: allergies, current medications, past OB history, past medical history, past surgical history, past family history, past social history, and problem list.    OBJECTIVE: Initial Physical Exam (New OB)  GENERAL APPEARANCE: alert, well appearing, in no apparent distress, oriented to person, place and time HEAD: normocephalic, atraumatic MOUTH: mucous membranes moist,  pharynx normal without lesions THYROID: no thyromegaly or masses present BREASTS:Deferred  LUNGS: clear to auscultation, no wheezes, rales or rhonchi, symmetric air entry HEART: regular rate and rhythm, no murmurs ABDOMEN: soft, nontender, nondistended, no abnormal masses, no epigastric pain EXTREMITIES: no redness or tenderness in the calves or thighs SKIN: normal coloration and turgor, no rashes LYMPH NODES: no adenopathy palpable NEUROLOGIC: alert, oriented, normal speech, no focal findings or movement disorder noted  PELVIC EXAM deferred  ASSESSMENT: Normal pregnancy  PLAN: Reviewed remaining care at [facility]. TDAP/BTC/CBC and urine culture today. Folder given with medication list and Prenatal classes. She denies any hx of medical problems. She is taking a PNV. She denies smoking/drugs/or alcohol. She exercises 3 x wk x 1 hr. She has had 2 previous normal pregnancies and vaginal deliveries with no complications. She is unsure of BC at this time. Information given. Discussed midwifery care and potential for female provider if MD backup needed. She verbalizes and agrees to plan. Follow up 2 wks.  Doreene Burke, CNM   Doreene Burke, CNM

## 2017-09-21 NOTE — Patient Instructions (Signed)

## 2017-09-22 LAB — CBC
Hematocrit: 24.6 % — ABNORMAL LOW (ref 34.0–46.6)
Hemoglobin: 7.9 g/dL — ABNORMAL LOW (ref 11.1–15.9)
MCH: 25.3 pg — ABNORMAL LOW (ref 26.6–33.0)
MCHC: 32.1 g/dL (ref 31.5–35.7)
MCV: 79 fL (ref 79–97)
PLATELETS: 236 10*3/uL (ref 150–450)
RBC: 3.12 x10E6/uL — ABNORMAL LOW (ref 3.77–5.28)
RDW: 13.7 % (ref 12.3–15.4)
WBC: 6.2 10*3/uL (ref 3.4–10.8)

## 2017-09-23 ENCOUNTER — Telehealth: Payer: Self-pay

## 2017-09-23 ENCOUNTER — Telehealth: Payer: Self-pay | Admitting: Obstetrics and Gynecology

## 2017-09-23 ENCOUNTER — Other Ambulatory Visit: Payer: Self-pay

## 2017-09-23 ENCOUNTER — Telehealth: Payer: Self-pay | Admitting: Certified Nurse Midwife

## 2017-09-23 DIAGNOSIS — O99013 Anemia complicating pregnancy, third trimester: Secondary | ICD-10-CM

## 2017-09-23 LAB — URINE CULTURE

## 2017-09-23 MED ORDER — FUSION PLUS PO CAPS: 1.0000 | ORAL_CAPSULE | Freq: Every day | ORAL | 6 refills | Status: DC

## 2017-09-23 NOTE — Telephone Encounter (Signed)
Pt informed of ATs orders. Labs ordered- pt has appt 09/24/17 at 230, Fusion Plus script sent to pharmacy. Will give diet instructions to pt 09/24/17.

## 2017-09-23 NOTE — Telephone Encounter (Signed)
The patient called and stated that she was informed to get an iron transfusion done this week. I was unsure if this service was done at our office so I informed the patient that a nurse would call her back and assist her. Please advise.

## 2017-09-23 NOTE — Telephone Encounter (Signed)
The patient called and stated that she would like to speak with a nurse in regard to her insurance not covering her iron supplements, The patient would like to know what iron supplements will be covered by insurance. Please advise.

## 2017-09-23 NOTE — Telephone Encounter (Signed)
Instruct her to start taking Iron, give her information on iron rich diet. Lets do a Folate, vitamin B 12 and ferritin level to start. I would like her to take fusion plus , can you put the order in and notify the patient.   Thanks,   Pattricia Boss

## 2017-09-23 NOTE — Telephone Encounter (Signed)
Kimberly Salinas,   This patient just started seeing [place]- she transferred her care. So I am not sure if that was from her previous provider? Her CBC was low, but I was not sure if she had been on additional supplemental iron or not. Can you please clarify this with her. If she has already been taking additional iron we will need to order Vitamin B lab , and iron. Please check with her and then let me know.   Thanks,  Pattricia Boss

## 2017-09-23 NOTE — Telephone Encounter (Signed)
Informed patient of neg culture results per AT.

## 2017-09-24 ENCOUNTER — Other Ambulatory Visit: Payer: Medicaid Other

## 2017-09-24 DIAGNOSIS — O99013 Anemia complicating pregnancy, third trimester: Secondary | ICD-10-CM

## 2017-09-25 LAB — FOLATE: FOLATE: 12.7 ng/mL (ref >3.0)

## 2017-09-25 LAB — VITAMIN B12: VITAMIN B 12: 241 pg/mL (ref 232–1245)

## 2017-09-25 LAB — FERRITIN: Ferritin: 4 ng/mL — ABNORMAL LOW (ref 15–150)

## 2017-09-28 NOTE — Telephone Encounter (Signed)
Put some samples of Ferralet to pick up

## 2017-10-06 ENCOUNTER — Ambulatory Visit (INDEPENDENT_AMBULATORY_CARE_PROVIDER_SITE_OTHER): Payer: Medicaid Other | Admitting: Obstetrics and Gynecology

## 2017-10-06 VITALS — BP 92/65 | HR 87 | Wt 175.6 lb

## 2017-10-06 DIAGNOSIS — Z3493 Encounter for supervision of normal pregnancy, unspecified, third trimester: Secondary | ICD-10-CM | POA: Diagnosis not present

## 2017-10-06 LAB — POCT URINALYSIS DIPSTICK OB
Bilirubin, UA: NEGATIVE
Glucose, UA: NEGATIVE
Ketones, UA: NEGATIVE
LEUKOCYTES UA: NEGATIVE
NITRITE UA: NEGATIVE
PH UA: 7 (ref 5.0–8.0)
RBC UA: NEGATIVE
SPEC GRAV UA: 1.015 (ref 1.010–1.025)
Urobilinogen, UA: 0.2 E.U./dL

## 2017-10-06 NOTE — Progress Notes (Signed)
ROB- doing well[facility] Taking fusion without difficulties.

## 2017-10-06 NOTE — Progress Notes (Signed)
ROB- pt is doing well 

## 2017-10-14 ENCOUNTER — Encounter: Payer: Self-pay | Admitting: Obstetrics and Gynecology

## 2017-10-21 ENCOUNTER — Ambulatory Visit (INDEPENDENT_AMBULATORY_CARE_PROVIDER_SITE_OTHER): Payer: Medicaid Other | Admitting: Certified Nurse Midwife

## 2017-10-21 VITALS — BP 97/54 | HR 83 | Wt 178.2 lb

## 2017-10-21 DIAGNOSIS — Z3493 Encounter for supervision of normal pregnancy, unspecified, third trimester: Secondary | ICD-10-CM

## 2017-10-21 LAB — POCT URINALYSIS DIPSTICK OB
Bilirubin, UA: NEGATIVE
GLUCOSE, UA: NEGATIVE
KETONES UA: NEGATIVE
LEUKOCYTES UA: NEGATIVE
NITRITE UA: NEGATIVE
RBC UA: NEGATIVE
SPEC GRAV UA: 1.015 (ref 1.010–1.025)
Urobilinogen, UA: 0.2 E.U./dL
pH, UA: 6.5 (ref 5.0–8.0)

## 2017-10-21 NOTE — Patient Instructions (Signed)
Group B Streptococcus Infection During Pregnancy Group B Streptococcus (GBS) is a type of bacteria (Streptococcus agalactiae) that is often found in healthy people, commonly in the rectum, vagina, and intestines. In people who are healthy and not pregnant, the bacteria rarely cause serious illness or complications. However, women who test positive for GBS during pregnancy can pass the bacteria to their baby during childbirth, which can cause serious infection in the baby after birth. Women with GBS may also have infections during their pregnancy or immediately after childbirth, such as such as urinary tract infections (UTIs) or infections of the uterus (uterine infections). Having GBS also increases a woman's risk of complications during pregnancy, such as early (preterm) labor or delivery, miscarriage, or stillbirth. Routine testing (screening) for GBS is recommended for all pregnant women. What increases the risk? You may have a higher risk for GBS infection during pregnancy if you had one during a past pregnancy. What are the signs or symptoms? In most cases, GBS infection does not cause symptoms in pregnant women. Signs and symptoms of a possible GBS-related infection may include:  Labor starting before the 37th week of pregnancy.  A UTI or bladder infection, which may cause: ? Fever. ? Pain or burning during urination. ? Frequent urination.  Fever during labor, along with: ? Bad-smelling discharge. ? Uterine tenderness. ? Rapid heartbeat in the mother, baby, or both.  Rare but serious symptoms of a possible GBS-related infection in women include:  Blood infection (septicemia). This may cause fever, chills, or confusion.  Lung infection (pneumonia). This may cause fever, chills, cough, rapid breathing, difficulty breathing, or chest pain.  Bone, joint, skin, or soft tissue infection.  How is this diagnosed? You may be screened for GBS between week 35 and week 37 of your pregnancy. If  you have symptoms of preterm labor, you may be screened earlier. This condition is diagnosed based on lab test results from:  A swab of fluid from the vagina and rectum.  A urine sample.  How is this treated? This condition is treated with antibiotic medicine. When you go into labor, or as soon as your water breaks (your membranes rupture), you will be given antibiotics through an IV tube. Antibiotics will continue until after you give birth. If you are having a cesarean delivery, you do not need antibiotics unless your membranes have already ruptured. Follow these instructions at home:  Take over-the-counter and prescription medicines only as told by your health care provider.  Take your antibiotic medicine as told by your health care provider. Do not stop taking the antibiotic even if you start to feel better.  Keep all pre-birth (prenatal) visits and follow-up visits as told by your health care provider. This is important. Contact a health care provider if:  You have pain or burning when you urinate.  You have to urinate frequently.  You have a fever or chills.  You develop a bad-smelling vaginal discharge. Get help right away if:  Your membranes rupture.  You go into labor.  You have severe pain in your abdomen.  You have difficulty breathing.  You have chest pain. This information is not intended to replace advice given to you by your health care provider. Make sure you discuss any questions you have with your health care provider. Document Released: 04/08/2007 Document Revised: 07/27/2015 Document Reviewed: 07/26/2015 Elsevier Interactive Patient Education  2018 [employer]vier Inc[employer]  

## 2017-10-21 NOTE — Progress Notes (Signed)
ROB, doing well. Feels good movement. Discussed gbs at next visit. CBC today anemic has been taking fusion plus. Will follow up with result. ROB 2 wks.  Doreene Burke, CNM

## 2017-10-22 LAB — CBC
Hematocrit: 23.2 % — ABNORMAL LOW (ref 34.0–46.6)
Hemoglobin: 7.5 g/dL — ABNORMAL LOW (ref 11.1–15.9)
MCH: 25.1 pg — AB (ref 26.6–33.0)
MCHC: 32.3 g/dL (ref 31.5–35.7)
MCV: 78 fL — AB (ref 79–97)
Platelets: 233 10*3/uL (ref 150–450)
RBC: 2.99 x10E6/uL — AB (ref 3.77–5.28)
RDW: 15.1 % (ref 12.3–15.4)
WBC: 7.5 10*3/uL (ref 3.4–10.8)

## 2017-10-23 ENCOUNTER — Telehealth: Payer: Self-pay

## 2017-10-23 ENCOUNTER — Other Ambulatory Visit: Payer: Self-pay | Admitting: Certified Nurse Midwife

## 2017-10-23 DIAGNOSIS — O99013 Anemia complicating pregnancy, third trimester: Secondary | ICD-10-CM

## 2017-10-23 NOTE — Telephone Encounter (Signed)
Message left for pt to return my call for test results.

## 2017-10-23 NOTE — Telephone Encounter (Signed)
error 

## 2017-10-23 NOTE — Telephone Encounter (Signed)
Pt returned my call- informed of ATs orders for an appt with hematologist due to continued low iron despite added iron. Requested she let us know if she does not hear anything in a week. Pt expressed understanding.

## 2017-10-23 NOTE — Progress Notes (Signed)
Referral to hematology for anemia in pregnancy. Pt notified by nurse.   Doreene Burke, CNM

## 2017-11-03 ENCOUNTER — Inpatient Hospital Stay: Payer: Medicaid Other | Attending: Oncology | Admitting: Oncology

## 2017-11-03 ENCOUNTER — Other Ambulatory Visit: Payer: Self-pay

## 2017-11-03 ENCOUNTER — Inpatient Hospital Stay: Payer: Medicaid Other

## 2017-11-03 ENCOUNTER — Encounter: Payer: Self-pay | Admitting: Oncology

## 2017-11-03 VITALS — BP 100/62 | HR 79 | Temp 97.6°F | Resp 18 | Wt 179.6 lb

## 2017-11-03 DIAGNOSIS — D509 Iron deficiency anemia, unspecified: Secondary | ICD-10-CM | POA: Insufficient documentation

## 2017-11-03 DIAGNOSIS — O99013 Anemia complicating pregnancy, third trimester: Secondary | ICD-10-CM

## 2017-11-03 DIAGNOSIS — D508 Other iron deficiency anemias: Secondary | ICD-10-CM | POA: Diagnosis not present

## 2017-11-03 DIAGNOSIS — Z3A36 36 weeks gestation of pregnancy: Secondary | ICD-10-CM | POA: Insufficient documentation

## 2017-11-03 LAB — CBC WITH DIFFERENTIAL/PLATELET
Abs Immature Granulocytes: 0.17 10*3/uL — ABNORMAL HIGH (ref 0.00–0.07)
BASOS ABS: 0 10*3/uL (ref 0.0–0.1)
Basophils Relative: 0 %
EOS ABS: 0.1 10*3/uL (ref 0.0–0.5)
EOS PCT: 1 %
HEMATOCRIT: 26 % — AB (ref 36.0–46.0)
HEMOGLOBIN: 7.9 g/dL — AB (ref 12.0–15.0)
Immature Granulocytes: 3 %
LYMPHS ABS: 1.3 10*3/uL (ref 0.7–4.0)
LYMPHS PCT: 20 %
MCH: 23.9 pg — ABNORMAL LOW (ref 26.0–34.0)
MCHC: 30.4 g/dL (ref 30.0–36.0)
MCV: 78.5 fL — AB (ref 80.0–100.0)
MONOS PCT: 8 %
Monocytes Absolute: 0.5 10*3/uL (ref 0.1–1.0)
NRBC: 0.5 % — AB (ref 0.0–0.2)
Neutro Abs: 4.4 10*3/uL (ref 1.7–7.7)
Neutrophils Relative %: 68 %
Platelets: 212 10*3/uL (ref 150–400)
RBC: 3.31 MIL/uL — ABNORMAL LOW (ref 3.87–5.11)
RDW: 15.5 % (ref 11.5–15.5)
WBC: 6.5 10*3/uL (ref 4.0–10.5)

## 2017-11-03 LAB — COMPREHENSIVE METABOLIC PANEL
ALBUMIN: 3 g/dL — AB (ref 3.5–5.0)
ALT: 8 U/L (ref 0–44)
AST: 18 U/L (ref 15–41)
Alkaline Phosphatase: 128 U/L — ABNORMAL HIGH (ref 38–126)
Anion gap: 8 (ref 5–15)
BILIRUBIN TOTAL: 0.4 mg/dL (ref 0.3–1.2)
BUN: 7 mg/dL (ref 6–20)
CALCIUM: 8.6 mg/dL — AB (ref 8.9–10.3)
CO2: 23 mmol/L (ref 22–32)
Chloride: 105 mmol/L (ref 98–111)
Creatinine, Ser: 0.58 mg/dL (ref 0.44–1.00)
GFR calc Af Amer: >60 mL/min (ref >60)
GFR calc non Af Amer: >60 mL/min (ref >60)
Glucose, Bld: 89 mg/dL (ref 70–99)
POTASSIUM: 4 mmol/L (ref 3.5–5.1)
SODIUM: 136 mmol/L (ref 135–145)
TOTAL PROTEIN: 6.7 g/dL (ref 6.5–8.1)

## 2017-11-03 LAB — IRON AND TIBC
Iron: 31 ug/dL (ref 28–170)
SATURATION RATIOS: 4 % — AB (ref 10.4–31.8)
TIBC: 741 ug/dL — AB (ref 250–450)
UIBC: 710 ug/dL

## 2017-11-03 LAB — FERRITIN: FERRITIN: 3 ng/mL — AB (ref 11–307)

## 2017-11-03 NOTE — Progress Notes (Signed)
Patient here today as a new patient for anemia with pregnancy.

## 2017-11-04 ENCOUNTER — Ambulatory Visit (INDEPENDENT_AMBULATORY_CARE_PROVIDER_SITE_OTHER): Payer: Medicaid Other | Admitting: Obstetrics and Gynecology

## 2017-11-04 VITALS — BP 112/74 | HR 89 | Wt 182.6 lb

## 2017-11-04 DIAGNOSIS — Z3493 Encounter for supervision of normal pregnancy, unspecified, third trimester: Secondary | ICD-10-CM

## 2017-11-04 DIAGNOSIS — Z113 Encounter for screening for infections with a predominantly sexual mode of transmission: Secondary | ICD-10-CM

## 2017-11-04 DIAGNOSIS — Z3685 Encounter for antenatal screening for Streptococcus B: Secondary | ICD-10-CM

## 2017-11-04 LAB — POCT URINALYSIS DIPSTICK OB
Bilirubin, UA: NEGATIVE
Glucose, UA: NEGATIVE
Ketones, UA: NEGATIVE
NITRITE UA: NEGATIVE
PH UA: 6.5 (ref 5.0–8.0)
RBC UA: NEGATIVE
SPEC GRAV UA: 1.01 (ref 1.010–1.025)
UROBILINOGEN UA: 0.2 U/dL

## 2017-11-04 NOTE — Progress Notes (Signed)
ROB- cultures obtained, pt is having some menstrual cramping, will start her iron infusion on Friday

## 2017-11-04 NOTE — Progress Notes (Signed)
ROB-cultures obtained,has weekly iron infusion scheduled starting Fridays. Labor precautions discussed.

## 2017-11-04 NOTE — Progress Notes (Signed)
Hematology/Oncology Consult note [facility] [phone] [phone]   Patient Care Team: Patient, No Pcp Per as PCP - General (General Practice)  REFERRING PROVIDER: OB/GYN  Doreene Burke CHIEF COMPLAINTS/REASON FOR VISIT:  Evaluation of anemia in pregnancy  HISTORY OF PRESENTING ILLNESS:  Kimberly Salinas is a  27 y.o.  female with PMH listed below who was referred to me for evaluation of anemia in pregnancy Reviewed patient's recent labs that was done done on 10/21/2017. Labs revealed anemia with hemoglobin of 7.5, MCV 78, normal platelet level and WBC.  No differential was obtained.  Ferritin was decreased at 4, on 09/24/2017.  Patient reports that he has been started on iron supplements with fusion plus orally.  She got about 2 weeks supply and did not continue due to the cost. Reviewed patient's previous labs , anemia is chronic onset , duration is since at least 2013. No aggravating or improving factors.  Associated signs and symptoms: Patient reports fatigue.  Denies SOB with exertion.  This is her third pregnancy. Denies weight loss, easy bruising, hematochezia, hemoptysis, hematuria. Context: History of GI bleeding: Denies                                           Last colonoscopy: Not available               History of heavy menstrual period.  Yes    Review of Systems  Constitutional: Positive for malaise/fatigue. Negative for chills and fever.  HENT: Negative for nosebleeds and sore throat.   Eyes: Negative for double vision, photophobia and redness.  Respiratory: Negative for cough, shortness of breath and wheezing.   Cardiovascular: Negative for chest pain, palpitations and orthopnea.  Gastrointestinal: Negative for abdominal pain, blood in stool, nausea and vomiting.  Genitourinary: Negative for dysuria.  Musculoskeletal: Negative for back pain, myalgias and neck pain.  Skin: Negative for itching and rash.    Neurological: Negative for dizziness, tingling and tremors.  Endo/Heme/Allergies: Negative for environmental allergies. Does not bruise/bleed easily.  Psychiatric/Behavioral: Negative for depression.    MEDICAL HISTORY:  Past Medical History:  Diagnosis Date  . Anemia   . No pertinent past medical history     SURGICAL HISTORY: Past Surgical History:  Procedure Laterality Date  . NO PAST SURGERIES      SOCIAL HISTORY: Social History   Socioeconomic History  . Marital status: Single    Spouse name: Not on file  . Number of children: 2  . Years of education: Not on file  . Highest education level: Not on file  Occupational History  . Not on file  Social Needs  . Financial resource strain: Not on file  . Food insecurity:    Worry: Not on file    Inability: Not on file  . Transportation needs:    Medical: Not on file    Non-medical: Not on file  Tobacco Use  . Smoking status: Never Smoker  . Smokeless tobacco: Never Used  Substance and Sexual Activity  . Alcohol use: No  . Drug use: No  . Sexual activity: Yes    Birth control/protection: None  Lifestyle  . Physical activity:    Days per week: Not on file    Minutes per session: Not on file  . Stress: Not on file  Relationships  . Social connections:    Talks  on phone: Not on file    Gets together: Not on file    Attends religious service: Not on file    Active member of club or organization: Not on file    Attends meetings of clubs or organizations: Not on file    Relationship status: Not on file  . Intimate partner violence:    Fear of current or ex partner: Not on file    Emotionally abused: Not on file    Physically abused: Not on file    Forced sexual activity: Not on file  Other Topics Concern  . Not on file  Social History Narrative  . Not on file    FAMILY HISTORY: Family History  Problem Relation Age of Onset  . Diabetes Paternal Aunt   . Hypertension Paternal Aunt   . Diabetes Paternal  Uncle   . Hypertension Paternal Uncle   . Anemia Mother   . Cancer Paternal Grandfather        Unknown type   . Anesthesia problems Neg Hx   . Hypotension Neg Hx   . Malignant hyperthermia Neg Hx   . Pseudochol deficiency Neg Hx     ALLERGIES:  has No Known Allergies.  MEDICATIONS:  Current Outpatient Medications  Medication Sig Dispense Refill  . Iron-FA-B Cmp-C-Biot-Probiotic (FUSION PLUS) CAPS Take 1 capsule by mouth daily. (Patient not taking: Reported on 11/04/2017) 30 capsule 6  . Prenatal Vit-Fe Fumarate-FA (PREPLUS) 27-1 MG TABS Take 1 tablet by mouth daily. 30 tablet 13   No current facility-administered medications for this visit.      PHYSICAL EXAMINATION: ECOG PERFORMANCE STATUS: 1 - Symptomatic but completely ambulatory Vitals:   11/03/17 1349  BP: 100/62  Pulse: 79  Resp: 18  Temp: 97.6 F (36.4 C)   Filed Weights   11/03/17 1349  Weight: 179 lb 9 oz (81.4 kg)    Physical Exam  Constitutional: She is oriented to person, place, and time. No distress.  HENT:  Head: Normocephalic and atraumatic.  Mouth/Throat: Oropharynx is clear and moist.  Eyes: Pupils are equal, round, and reactive to light. EOM are normal. No scleral icterus.  Pale conjunctivae  Neck: Normal range of motion. Neck supple.  Cardiovascular: Normal rate, regular rhythm and normal heart sounds.  Pulmonary/Chest: Effort normal. No respiratory distress. She has no wheezes.  Abdominal: Soft. Bowel sounds are normal. She exhibits no mass. There is no tenderness.  Gravidas uterus  Musculoskeletal: Normal range of motion. She exhibits no edema or deformity.  Neurological: She is alert and oriented to person, place, and time. No cranial nerve deficit. Coordination normal.  Skin: Skin is warm and dry. No rash noted. No erythema.  Psychiatric: She has a normal mood and affect. Her behavior is normal. Thought content normal.     LABORATORY DATA:  I have reviewed the data as listed Lab  Results  Component Value Date   WBC 6.5 11/03/2017   HGB 7.9 (L) 11/03/2017   HCT 26.0 (L) 11/03/2017   MCV 78.5 (L) 11/03/2017   PLT 212 11/03/2017   Recent Labs    11/03/17 1114  NA 136  K 4.0  CL 105  CO2 23  GLUCOSE 89  BUN 7  CREATININE 0.58  CALCIUM 8.6*  GFRNONAA >60  GFRAA >60  PROT 6.7  ALBUMIN 3.0*  AST 18  ALT 8  ALKPHOS 128*  BILITOT 0.4   Iron/TIBC/Ferritin/ %Sat    Component Value Date/Time   IRON 31 11/03/2017 1114   TIBC  741 (H) 11/03/2017 1114   FERRITIN 3 (L) 11/03/2017 1114   FERRITIN 4 (L) 09/24/2017 1420   IRONPCTSAT 4 (L) 11/03/2017 1114        ASSESSMENT & PLAN:  1. Anemia affecting pregnancy in third trimester   2. Other iron deficiency anemia    Labs reviewed and discussed with patient.  Consistent with severe iron deficiency.  Patient is not compliant with oral iron supplements. She is due in 4 weeks.  Recommend IV iron. Plan IV iron with Venofer 200mg  weekly x 4 doses. Allergy reactions/infusion reaction including anaphylactic reaction discussed with patient. Other side effects include but not limited to high blood pressure, skin rash, weight gain, leg swelling, etc. Patient voices understanding and willing to proceed.  Prior to starting iron infusion, I will repeat CBC, smear, and other full iron panel as a baseline.  Repeat CBC, iron, ferritin, TIBC in 8 weeks and to follow-up in the clinic for assessment for additional IV iron.  Orders Placed This Encounter  Procedures  . CBC with Differential/Platelet    Standing Status:   Future    Number of Occurrences:   1    Standing Expiration Date:   11/04/2018  . Iron and TIBC    Standing Status:   Future    Number of Occurrences:   1    Standing Expiration Date:   11/04/2018  . Ferritin    Standing Status:   Future    Number of Occurrences:   1    Standing Expiration Date:   11/04/2018  . Comprehensive metabolic panel    Standing Status:   Future    Number of Occurrences:   1     Standing Expiration Date:   11/04/2018    All questions were answered. The patient knows to call the clinic with any problems questions or concerns.  Return of visit: 8 weeks  thank you for this kind referral and the opportunity to participate in the care of this patient. A copy of today's note is routed to referring provider  Total face to face encounter time for this patient visit was . >50% of the time was  spent in counseling and coordination of care.    Rickard Patience, MD, PhD Hematology Oncology [facility] Pager- [phone] 11/04/2017

## 2017-11-06 ENCOUNTER — Inpatient Hospital Stay: Payer: Medicaid Other

## 2017-11-06 VITALS — BP 107/65 | HR 93 | Temp 98.9°F | Resp 18

## 2017-11-06 DIAGNOSIS — D508 Other iron deficiency anemias: Secondary | ICD-10-CM | POA: Diagnosis not present

## 2017-11-06 LAB — GC/CHLAMYDIA PROBE AMP
Chlamydia trachomatis, NAA: NEGATIVE
NEISSERIA GONORRHOEAE BY PCR: NEGATIVE

## 2017-11-06 LAB — STREP GP B NAA: STREP GROUP B AG: NEGATIVE

## 2017-11-06 MED ORDER — SODIUM CHLORIDE 0.9 % IV SOLN
Freq: Once | INTRAVENOUS | Status: AC
  Administered 2017-11-06: 14:00:00 via INTRAVENOUS
  Filled 2017-11-06: qty 250

## 2017-11-06 MED ORDER — IRON SUCROSE 20 MG/ML IV SOLN
200.0000 mg | Freq: Once | INTRAVENOUS | Status: AC
  Administered 2017-11-06: 200 mg via INTRAVENOUS
  Filled 2017-11-06 (×2): qty 10

## 2017-11-06 MED ORDER — SODIUM CHLORIDE 0.9 % IV SOLN: 200.0000 mg | Freq: Once | INTRAVENOUS | Status: DC

## 2017-11-13 ENCOUNTER — Inpatient Hospital Stay: Payer: Medicaid Other | Attending: Oncology

## 2017-11-13 ENCOUNTER — Ambulatory Visit (INDEPENDENT_AMBULATORY_CARE_PROVIDER_SITE_OTHER): Payer: Medicaid Other | Admitting: Certified Nurse Midwife

## 2017-11-13 VITALS — BP 124/77 | HR 109 | Temp 97.7°F | Resp 18

## 2017-11-13 VITALS — BP 94/57 | HR 87 | Wt 183.3 lb

## 2017-11-13 DIAGNOSIS — K2970 Gastritis, unspecified, without bleeding: Secondary | ICD-10-CM | POA: Insufficient documentation

## 2017-11-13 DIAGNOSIS — D500 Iron deficiency anemia secondary to blood loss (chronic): Secondary | ICD-10-CM | POA: Diagnosis not present

## 2017-11-13 DIAGNOSIS — O9989 Other specified diseases and conditions complicating pregnancy, childbirth and the puerperium: Secondary | ICD-10-CM

## 2017-11-13 DIAGNOSIS — N920 Excessive and frequent menstruation with regular cycle: Secondary | ICD-10-CM | POA: Diagnosis not present

## 2017-11-13 DIAGNOSIS — Z3493 Encounter for supervision of normal pregnancy, unspecified, third trimester: Secondary | ICD-10-CM

## 2017-11-13 DIAGNOSIS — R319 Hematuria, unspecified: Secondary | ICD-10-CM

## 2017-11-13 DIAGNOSIS — D508 Other iron deficiency anemias: Secondary | ICD-10-CM

## 2017-11-13 MED ORDER — IRON SUCROSE 20 MG/ML IV SOLN
200.0000 mg | Freq: Once | INTRAVENOUS | Status: AC
  Administered 2017-11-13: 200 mg via INTRAVENOUS
  Filled 2017-11-13: qty 10

## 2017-11-13 MED ORDER — SODIUM CHLORIDE 0.9 % IV SOLN
Freq: Once | INTRAVENOUS | Status: AC
  Administered 2017-11-13: 14:00:00 via INTRAVENOUS
  Filled 2017-11-13: qty 250

## 2017-11-13 NOTE — Patient Instructions (Signed)
Vaginal Delivery Vaginal delivery means that you will give birth by pushing your baby out of your birth canal (vagina). A team of health care providers will help you before, during, and after vaginal delivery. Birth experiences are unique for every woman and every pregnancy, and birth experiences vary depending on where you choose to give birth. What should I do to prepare for my baby's birth? Before your baby is born, it is important to talk with your health care provider about:  Your labor and delivery preferences. These may include: ? Medicines that you may be given. ? How you will manage your pain. This might include non-medical pain relief techniques or injectable pain relief such as epidural analgesia. ? How you and your baby will be monitored during labor and delivery. ? Who may be in the labor and delivery room with you. ? Your feelings about surgical delivery of your baby (cesarean delivery, or C-section) if this becomes necessary. ? Your feelings about receiving donated blood through an IV tube (blood transfusion) if this becomes necessary.  Whether you are able: ? To take pictures or videos of the birth. ? To eat during labor and delivery. ? To move around, walk, or change positions during labor and delivery.  What to expect after your baby is born, such as: ? Whether delayed umbilical cord clamping and cutting is offered. ? Who will care for your baby right after birth. ? Medicines or tests that may be recommended for your baby. ? Whether breastfeeding is supported in your hospital or birth center. ? How long you will be in the hospital or birth center.  How any medical conditions you have may affect your baby or your labor and delivery experience.  To prepare for your baby's birth, you should also:  Attend all of your health care visits before delivery (prenatal visits) as recommended by your health care provider. This is important.  Prepare your home for your baby's  arrival. Make sure that you have: ? Diapers. ? Baby clothing. ? Feeding equipment. ? Safe sleeping arrangements for you and your baby.  Install a car seat in your vehicle. Have your car seat checked by a certified car seat installer to make sure that it is installed safely.  Think about who will help you with your new baby at home for at least the first several weeks after delivery.  What can I expect when I arrive at the birth center or hospital? Once you are in labor and have been admitted into the hospital or birth center, your health care provider may:  Review your pregnancy history and any concerns you have.  Insert an IV tube into one of your veins. This is used to give you fluids and medicines.  Check your blood pressure, pulse, temperature, and heart rate (vital signs).  Check whether your bag of water (amniotic sac) has broken (ruptured).  Talk with you about your birth plan and discuss pain control options.  Monitoring Your health care provider may monitor your contractions (uterine monitoring) and your baby's heart rate (fetal monitoring). You may need to be monitored:  Often, but not continuously (intermittently).  All the time or for long periods at a time (continuously). Continuous monitoring may be needed if: ? You are taking certain medicines, such as medicine to relieve pain or make your contractions stronger. ? You have pregnancy or labor complications.  Monitoring may be done by:  Placing a special stethoscope or a handheld monitoring device on your abdomen to   check your baby's heartbeat, and feeling your abdomen for contractions. This method of monitoring does not continuously record your baby's heartbeat or your contractions.  Placing monitors on your abdomen (external monitors) to record your baby's heartbeat and the frequency and length of contractions. You may not have to wear external monitors all the time.  Placing monitors inside of your uterus  (internal monitors) to record your baby's heartbeat and the frequency, length, and strength of your contractions. ? Your health care provider may use internal monitors if he or she needs more information about the strength of your contractions or your baby's heart rate. ? Internal monitors are put in place by passing a thin, flexible wire through your vagina and into your uterus. Depending on the type of monitor, it may remain in your uterus or on your baby's head until birth. ? Your health care provider will discuss the benefits and risks of internal monitoring with you and will ask for your permission before inserting the monitors.  Telemetry. This is a type of continuous monitoring that can be done with external or internal monitors. Instead of having to stay in bed, you are able to move around during telemetry. Ask your health care provider if telemetry is an option for you.  Physical exam Your health care provider may perform a physical exam. This may include:  Checking whether your baby is positioned: ? With the head toward your vagina (head-down). This is most common. ? With the head toward the top of your uterus (head-up or breech). If your baby is in a breech position, your health care provider may try to turn your baby to a head-down position so you can deliver vaginally. If it does not seem that your baby can be born vaginally, your provider may recommend surgery to deliver your baby. In rare cases, you may be able to deliver vaginally if your baby is head-up (breech delivery). ? Lying sideways (transverse). Babies that are lying sideways cannot be delivered vaginally.  Checking your cervix to determine: ? Whether it is thinning out (effacing). ? Whether it is opening up (dilating). ? How low your baby has moved into your birth canal.  What are the three stages of labor and delivery?  Normal labor and delivery is divided into the following three stages: Stage 1  Stage 1 is the  longest stage of labor, and it can last for hours or days. Stage 1 includes: ? Early labor. This is when contractions may be irregular, or regular and mild. Generally, early labor contractions are more than 10 minutes apart. ? Active labor. This is when contractions get longer, more regular, more frequent, and more intense. ? The transition phase. This is when contractions happen very close together, are very intense, and may last longer than during any other part of labor.  Contractions generally feel mild, infrequent, and irregular at first. They get stronger, more frequent (about every 2-3 minutes), and more regular as you progress from early labor through active labor and transition.  Many women progress through stage 1 naturally, but you may need help to continue making progress. If this happens, your health care provider may talk with you about: ? Rupturing your amniotic sac if it has not ruptured yet. ? Giving you medicine to help make your contractions stronger and more frequent.  Stage 1 ends when your cervix is completely dilated to 4 inches (10 cm) and completely effaced. This happens at the end of the transition phase. Stage 2  Once   your cervix is completely effaced and dilated to 4 inches (10 cm), you may start to feel an urge to push. It is common for the body to naturally take a rest before feeling the urge to push, especially if you received an epidural or certain other pain medicines. This rest period may last for up to 1-2 hours, depending on your unique labor experience.  During stage 2, contractions are generally less painful, because pushing helps relieve contraction pain. Instead of contraction pain, you may feel stretching and burning pain, especially when the widest part of your baby's head passes through the vaginal opening (crowning).  Your health care provider will closely monitor your pushing progress and your baby's progress through the vagina during stage 2.  Your  health care provider may massage the area of skin between your vaginal opening and anus (perineum) or apply warm compresses to your perineum. This helps it stretch as the baby's head starts to crown, which can help prevent perineal tearing. ? In some cases, an incision may be made in your perineum (episiotomy) to allow the baby to pass through the vaginal opening. An episiotomy helps to make the opening of the vagina larger to allow more room for the baby to fit through.  It is very important to breathe and focus so your health care provider can control the delivery of your baby's head. Your health care provider may have you decrease the intensity of your pushing, to help prevent perineal tearing.  After delivery of your baby's head, the shoulders and the rest of the body generally deliver very quickly and without difficulty.  Once your baby is delivered, the umbilical cord may be cut right away, or this may be delayed for 1-2 minutes, depending on your baby's health. This may vary among health care providers, hospitals, and birth centers.  If you and your baby are healthy enough, your baby may be placed on your chest or abdomen to help maintain the baby's temperature and to help you bond with each other. Some mothers and babies start breastfeeding at this time. Your health care team will dry your baby and help keep your baby warm during this time.  Your baby may need immediate care if he or she: ? Showed signs of distress during labor. ? Has a medical condition. ? Was born too early (prematurely). ? Had a bowel movement before birth (meconium). ? Shows signs of difficulty transitioning from being inside the uterus to being outside of the uterus. If you are planning to breastfeed, your health care team will help you begin a feeding. Stage 3  The third stage of labor starts immediately after the birth of your baby and ends after you deliver the placenta. The placenta is an organ that develops  during pregnancy to provide oxygen and nutrients to your baby in the womb.  Delivering the placenta may require some pushing, and you may have mild contractions. Breastfeeding can stimulate contractions to help you deliver the placenta.  After the placenta is delivered, your uterus should tighten (contract) and become firm. This helps to stop bleeding in your uterus. To help your uterus contract and to control bleeding, your health care provider may: ? Give you medicine by injection, through an IV tube, by mouth, or through your rectum (rectally). ? Massage your abdomen or perform a vaginal exam to remove any blood clots that are left in your uterus. ? Empty your bladder by placing a thin, flexible tube (catheter) into your bladder. ? Encourage   you to breastfeed your baby. After labor is over, you and your baby will be monitored closely to ensure that you are both healthy until you are ready to go home. Your health care team will teach you how to care for yourself and your baby. This information is not intended to replace advice given to you by your health care provider. Make sure you discuss any questions you have with your health care provider. Document Released: 10/09/2007 Document Revised: 07/20/2015 Document Reviewed: 01/14/2015 Elsevier Interactive Patient Education  2018 [employer]  

## 2017-11-13 NOTE — Progress Notes (Signed)
Kimberly Salinas-Doing well. Iron transfusion every Friday for the next two (2) weeks. Urine dipstick shows positive for WBC's, positive for RBC's and positive for protein; will send culture, see orders. Mom, Dad Danelle Earthly), and friend as labor support. Plans epidural. Reviewed red flag symptoms and when to call. RTC x 1 week for Kimberly Salinas or sooner if needed.

## 2017-11-13 NOTE — Progress Notes (Signed)
ROB, c/o irregular UC's.  

## 2017-11-20 ENCOUNTER — Encounter: Payer: Self-pay | Admitting: Certified Nurse Midwife

## 2017-11-20 ENCOUNTER — Ambulatory Visit (INDEPENDENT_AMBULATORY_CARE_PROVIDER_SITE_OTHER): Payer: Medicaid Other | Admitting: Certified Nurse Midwife

## 2017-11-20 ENCOUNTER — Inpatient Hospital Stay: Payer: Medicaid Other

## 2017-11-20 VITALS — BP 118/65 | HR 97 | Temp 96.7°F | Resp 18

## 2017-11-20 VITALS — BP 110/55 | HR 77 | Wt 183.3 lb

## 2017-11-20 DIAGNOSIS — D508 Other iron deficiency anemias: Secondary | ICD-10-CM

## 2017-11-20 DIAGNOSIS — Z3A40 40 weeks gestation of pregnancy: Secondary | ICD-10-CM

## 2017-11-20 DIAGNOSIS — Z3A38 38 weeks gestation of pregnancy: Secondary | ICD-10-CM

## 2017-11-20 DIAGNOSIS — D500 Iron deficiency anemia secondary to blood loss (chronic): Secondary | ICD-10-CM | POA: Diagnosis not present

## 2017-11-20 LAB — POCT URINALYSIS DIPSTICK OB
BILIRUBIN UA: NEGATIVE
Glucose, UA: NEGATIVE
Ketones, UA: NEGATIVE
Leukocytes, UA: NEGATIVE
Nitrite, UA: NEGATIVE
PH UA: 6 (ref 5.0–8.0)
RBC UA: NEGATIVE
Spec Grav, UA: 1.01 (ref 1.010–1.025)
Urobilinogen, UA: 0.2 E.U./dL

## 2017-11-20 MED ORDER — IRON SUCROSE 20 MG/ML IV SOLN
200.0000 mg | Freq: Once | INTRAVENOUS | Status: AC
  Administered 2017-11-20: 200 mg via INTRAVENOUS
  Filled 2017-11-20: qty 10

## 2017-11-20 MED ORDER — SODIUM CHLORIDE 0.9 % IV SOLN
Freq: Once | INTRAVENOUS | Status: AC
  Administered 2017-11-20: 14:00:00 via INTRAVENOUS
  Filled 2017-11-20: qty 250

## 2017-11-20 NOTE — Progress Notes (Signed)
ROB, doing well , feels good movement. Has been feeling more pressure and cramping. Declines SVE today. Discussed BPP at next visit . She verbalizes and agrees to plan of care. Follow up around 1 wk.   Doreene Burke, CNM

## 2017-11-20 NOTE — Patient Instructions (Signed)
Braxton Hicks Contractions °Contractions of the uterus can occur throughout pregnancy, but they are not always a sign that you are in labor. You may have practice contractions called Braxton Hicks contractions. These false labor contractions are sometimes confused with true labor. °What are Braxton Hicks contractions? °Braxton Hicks contractions are tightening movements that occur in the muscles of the uterus before labor. Unlike true labor contractions, these contractions do not result in opening (dilation) and thinning of the cervix. Toward the end of pregnancy (32-34 weeks), Braxton Hicks contractions can happen more often and may become stronger. These contractions are sometimes difficult to tell apart from true labor because they can be very uncomfortable. You should not feel embarrassed if you go to the hospital with false labor. °Sometimes, the only way to tell if you are in true labor is for your health care provider to look for changes in the cervix. The health care provider will do a physical exam and may monitor your contractions. If you are not in true labor, the exam should show that your cervix is not dilating and your water has not broken. °If there are other health problems associated with your pregnancy, it is completely safe for you to be sent home with false labor. You may continue to have Braxton Hicks contractions until you go into true labor. °How to tell the difference between true labor and false labor °True labor °· Contractions last 30-70 seconds. °· Contractions become very regular. °· Discomfort is usually felt in the top of the uterus, and it spreads to the lower abdomen and low back. °· Contractions do not go away with walking. °· Contractions usually become more intense and increase in frequency. °· The cervix dilates and gets thinner. °False labor °· Contractions are usually shorter and not as strong as true labor contractions. °· Contractions are usually irregular. °· Contractions  are often felt in the front of the lower abdomen and in the groin. °· Contractions may go away when you walk around or change positions while lying down. °· Contractions get weaker and are shorter-lasting as time goes on. °· The cervix usually does not dilate or become thin. °Follow these instructions at home: °· Take over-the-counter and prescription medicines only as told by your health care provider. °· Keep up with your usual exercises and follow other instructions from your health care provider. °· Eat and drink lightly if you think you are going into labor. °· If Braxton Hicks contractions are making you uncomfortable: °? Change your position from lying down or resting to walking, or change from walking to resting. °? Sit and rest in a tub of warm water. °? Drink enough fluid to keep your urine pale yellow. Dehydration may cause these contractions. °? Do slow and deep breathing several times an hour. °· Keep all follow-up prenatal visits as told by your health care provider. This is important. °Contact a health care provider if: °· You have a fever. °· You have continuous pain in your abdomen. °Get help right away if: °· Your contractions become stronger, more regular, and closer together. °· You have fluid leaking or gushing from your vagina. °· You pass blood-tinged mucus (bloody show). °· You have bleeding from your vagina. °· You have low back pain that you never had before. °· You feel your baby’s head pushing down and causing pelvic pressure. °· Your baby is not moving inside you as much as it used to. °Summary °· Contractions that occur before labor are called Braxton   Hicks contractions, false labor, or practice contractions. °· Braxton Hicks contractions are usually shorter, weaker, farther apart, and less regular than true labor contractions. True labor contractions usually become progressively stronger and regular and they become more frequent. °· Manage discomfort from Braxton Hicks contractions by  changing position, resting in a warm bath, drinking plenty of water, or practicing deep breathing. °This information is not intended to replace advice given to you by your health care provider. Make sure you discuss any questions you have with your health care provider. °Document Released: 05/15/2016 Document Revised: 05/15/2016 Document Reviewed: 05/15/2016 °Elsevier Interactive Patient Education © 2018 [employer] Inc[employer] ° °

## 2017-11-27 ENCOUNTER — Inpatient Hospital Stay: Payer: Medicaid Other

## 2017-11-27 VITALS — BP 115/70 | HR 85 | Temp 96.5°F | Resp 20

## 2017-11-27 DIAGNOSIS — D508 Other iron deficiency anemias: Secondary | ICD-10-CM

## 2017-11-27 DIAGNOSIS — D500 Iron deficiency anemia secondary to blood loss (chronic): Secondary | ICD-10-CM | POA: Diagnosis not present

## 2017-11-27 MED ORDER — SODIUM CHLORIDE 0.9 % IV SOLN
Freq: Once | INTRAVENOUS | Status: AC
  Administered 2017-11-27: 14:00:00 via INTRAVENOUS
  Filled 2017-11-27: qty 250

## 2017-11-27 MED ORDER — IRON SUCROSE 20 MG/ML IV SOLN
200.0000 mg | Freq: Once | INTRAVENOUS | Status: AC
  Administered 2017-11-27: 200 mg via INTRAVENOUS
  Filled 2017-11-27: qty 10

## 2017-11-29 ENCOUNTER — Observation Stay
Admission: EM | Admit: 2017-11-29 | Discharge: 2017-11-30 | Disposition: A | Discharge status: Home / Self Care | Payer: Medicaid Other | Source: Home / Self Care | Admitting: Obstetrics and Gynecology

## 2017-11-29 ENCOUNTER — Other Ambulatory Visit: Payer: Self-pay

## 2017-11-29 NOTE — OB Triage Note (Signed)
Pt arrived to unit with complaints of vaginal spotting approximately quarter sized starting this am. Denies leaking of fluid, intercourse in last 24 hours. +FM. Irregular ctx all day. Will continue to assess.

## 2017-11-30 ENCOUNTER — Inpatient Hospital Stay: Payer: Medicaid Other | Admitting: Certified Registered"

## 2017-11-30 ENCOUNTER — Inpatient Hospital Stay
Admission: EM | Admit: 2017-11-30 | Discharge: 2017-12-01 | DRG: 806 | Disposition: A | Discharge status: Home / Self Care | Payer: Medicaid Other | Attending: Certified Nurse Midwife | Admitting: Certified Nurse Midwife

## 2017-11-30 ENCOUNTER — Other Ambulatory Visit: Payer: Self-pay

## 2017-11-30 DIAGNOSIS — Z3A40 40 weeks gestation of pregnancy: Secondary | ICD-10-CM

## 2017-11-30 DIAGNOSIS — D649 Anemia, unspecified: Secondary | ICD-10-CM | POA: Diagnosis present

## 2017-11-30 DIAGNOSIS — O9902 Anemia complicating childbirth: Secondary | ICD-10-CM | POA: Diagnosis present

## 2017-11-30 DIAGNOSIS — Z3483 Encounter for supervision of other normal pregnancy, third trimester: Secondary | ICD-10-CM | POA: Diagnosis present

## 2017-11-30 DIAGNOSIS — Z3480 Encounter for supervision of other normal pregnancy, unspecified trimester: Secondary | ICD-10-CM

## 2017-11-30 LAB — CBC
HEMATOCRIT: 34.9 % — AB (ref 36.0–46.0)
HEMOGLOBIN: 10.5 g/dL — AB (ref 12.0–15.0)
MCH: 24.8 pg — AB (ref 26.0–34.0)
MCHC: 30.1 g/dL (ref 30.0–36.0)
MCV: 82.3 fL (ref 80.0–100.0)
Platelets: 241 10*3/uL (ref 150–400)
RBC: 4.24 MIL/uL (ref 3.87–5.11)
RDW: 22.7 % — ABNORMAL HIGH (ref 11.5–15.5)
WBC: 10.8 10*3/uL — ABNORMAL HIGH (ref 4.0–10.5)
nRBC: 0.3 % — ABNORMAL HIGH (ref 0.0–0.2)

## 2017-11-30 LAB — TYPE AND SCREEN
ABO/RH(D): A POS
Antibody Screen: NEGATIVE

## 2017-11-30 MED ORDER — PRENATAL MULTIVITAMIN CH: 1.0000 | ORAL_TABLET | Freq: Every day | ORAL | Status: DC

## 2017-11-30 MED ORDER — PRENATAL MULTIVITAMIN CH
1.0000 | ORAL_TABLET | Freq: Every day | ORAL | Status: DC
  Administered 2017-11-30 – 2017-12-01 (×2): 1 via ORAL
  Filled 2017-11-30 (×2): qty 1

## 2017-11-30 MED ORDER — LIDOCAINE HCL (PF) 1 % IJ SOLN
INTRAMUSCULAR | Status: AC
  Filled 2017-11-30: qty 30

## 2017-11-30 MED ORDER — OXYCODONE-ACETAMINOPHEN 5-325 MG PO TABS: 2.0000 | ORAL_TABLET | ORAL | Status: DC | PRN

## 2017-11-30 MED ORDER — LIDOCAINE HCL (PF) 1 % IJ SOLN
INTRAMUSCULAR | Status: DC | PRN
  Administered 2017-11-30: 3 mL via INTRADERMAL

## 2017-11-30 MED ORDER — SOD CITRATE-CITRIC ACID 500-334 MG/5ML PO SOLN: 30.0000 mL | ORAL | Status: DC | PRN

## 2017-11-30 MED ORDER — FENTANYL 2.5 MCG/ML W/ROPIVACAINE 0.15% IN NS 100 ML EPIDURAL (ARMC)
EPIDURAL | Status: AC
  Filled 2017-11-30: qty 100

## 2017-11-30 MED ORDER — COCONUT OIL OIL: 1.0000 "application " | TOPICAL_OIL | Status: DC | PRN

## 2017-11-30 MED ORDER — OXYCODONE-ACETAMINOPHEN 5-325 MG PO TABS: 1.0000 | ORAL_TABLET | ORAL | Status: DC | PRN

## 2017-11-30 MED ORDER — OXYTOCIN 10 UNIT/ML IJ SOLN
INTRAMUSCULAR | Status: AC
  Filled 2017-11-30: qty 2

## 2017-11-30 MED ORDER — ACETAMINOPHEN 325 MG PO TABS: 650.0000 mg | ORAL_TABLET | ORAL | Status: DC | PRN

## 2017-11-30 MED ORDER — DOCUSATE SODIUM 100 MG PO CAPS
100.0000 mg | ORAL_CAPSULE | Freq: Two times a day (BID) | ORAL | Status: DC
  Administered 2017-11-30 – 2017-12-01 (×2): 100 mg via ORAL
  Filled 2017-11-30 (×2): qty 1

## 2017-11-30 MED ORDER — AMMONIA AROMATIC IN INHA
RESPIRATORY_TRACT | Status: AC
  Filled 2017-11-30: qty 10

## 2017-11-30 MED ORDER — OXYTOCIN BOLUS FROM INFUSION
500.0000 mL | Freq: Once | INTRAVENOUS | Status: AC
  Administered 2017-11-30: 500 mL via INTRAVENOUS

## 2017-11-30 MED ORDER — IBUPROFEN 600 MG PO TABS
600.0000 mg | ORAL_TABLET | Freq: Four times a day (QID) | ORAL | Status: DC
  Administered 2017-11-30 – 2017-12-01 (×4): 600 mg via ORAL
  Filled 2017-11-30 (×4): qty 1

## 2017-11-30 MED ORDER — ONDANSETRON HCL 4 MG/2ML IJ SOLN: 4.0000 mg | INTRAMUSCULAR | Status: DC | PRN

## 2017-11-30 MED ORDER — BUTORPHANOL TARTRATE 1 MG/ML IJ SOLN
1.0000 mg | INTRAMUSCULAR | Status: DC | PRN
  Administered 2017-11-30: 1 mg via INTRAVENOUS
  Filled 2017-11-30: qty 1

## 2017-11-30 MED ORDER — MISOPROSTOL 200 MCG PO TABS
ORAL_TABLET | ORAL | Status: AC
  Administered 2017-11-30: 400 ug
  Filled 2017-11-30: qty 4

## 2017-11-30 MED ORDER — FENTANYL 2.5 MCG/ML W/ROPIVACAINE 0.15% IN NS 100 ML EPIDURAL (ARMC)
EPIDURAL | Status: DC | PRN
  Administered 2017-11-30: 12 mL/h via EPIDURAL

## 2017-11-30 MED ORDER — DIPHENHYDRAMINE HCL 25 MG PO CAPS: 25.0000 mg | ORAL_CAPSULE | Freq: Four times a day (QID) | ORAL | Status: DC | PRN

## 2017-11-30 MED ORDER — LACTATED RINGERS IV SOLN
INTRAVENOUS | Status: DC
  Administered 2017-11-30 (×2): via INTRAVENOUS

## 2017-11-30 MED ORDER — LIDOCAINE HCL (PF) 1 % IJ SOLN: 30.0000 mL | INTRAMUSCULAR | Status: DC | PRN

## 2017-11-30 MED ORDER — OXYTOCIN 40 UNITS IN LACTATED RINGERS INFUSION - SIMPLE MED
2.5000 [IU]/h | INTRAVENOUS | Status: DC
  Administered 2017-11-30: 39.96 [IU]/h via INTRAVENOUS
  Filled 2017-11-30: qty 1000

## 2017-11-30 MED ORDER — LIDOCAINE-EPINEPHRINE (PF) 1.5 %-1:200000 IJ SOLN
INTRAMUSCULAR | Status: DC | PRN
  Administered 2017-11-30: 3 mL via EPIDURAL

## 2017-11-30 MED ORDER — BENZOCAINE-MENTHOL 20-0.5 % EX AERO: 1.0000 "application " | INHALATION_SPRAY | CUTANEOUS | Status: DC | PRN

## 2017-11-30 MED ORDER — SIMETHICONE 80 MG PO CHEW: 80.0000 mg | CHEWABLE_TABLET | ORAL | Status: DC | PRN

## 2017-11-30 MED ORDER — ONDANSETRON HCL 4 MG/2ML IJ SOLN: 4.0000 mg | Freq: Four times a day (QID) | INTRAMUSCULAR | Status: DC | PRN

## 2017-11-30 MED ORDER — LACTATED RINGERS IV SOLN
500.0000 mL | INTRAVENOUS | Status: DC | PRN
  Administered 2017-11-30: 500 mL via INTRAVENOUS

## 2017-11-30 MED ORDER — BUPIVACAINE HCL (PF) 0.25 % IJ SOLN
INTRAMUSCULAR | Status: DC | PRN
  Administered 2017-11-30 (×2): 5 mL via EPIDURAL

## 2017-11-30 MED ORDER — DIBUCAINE 1 % RE OINT: 1.0000 "application " | TOPICAL_OINTMENT | RECTAL | Status: DC | PRN

## 2017-11-30 MED ORDER — ONDANSETRON HCL 4 MG PO TABS: 4.0000 mg | ORAL_TABLET | ORAL | Status: DC | PRN

## 2017-11-30 MED ORDER — WITCH HAZEL-GLYCERIN EX PADS: 1.0000 "application " | MEDICATED_PAD | CUTANEOUS | Status: DC | PRN

## 2017-11-30 NOTE — Progress Notes (Signed)
Midwife notified of Infiltrated IV. Patient does not want to have another IV placed and provider okay with her not having another IV as long as her bleeding and recovery is stable. Nurse M. Forsberg RN notified.

## 2017-11-30 NOTE — Progress Notes (Signed)
Post Partum Day of- 2 hours post delivery, Subjective: tolerating PO and moderate cramping  Objective: Blood pressure (!) 103/46, pulse 83, temperature 98.2 F (36.8 C), temperature source Oral, resp. rate 18, height 5\' 4"  (1.626 m), weight 83 kg, last menstrual period 02/22/2017, SpO2 98 %, unknown if currently breastfeeding.  Physical Exam:  General: alert, cooperative, appears stated age and no distress Lochia: increased with clots, total 510cc in first hour, and large clot with total    360cc on exam at 95 minutes after delivery, lower uterine segment boggy but responded to bimanual when clot removed. Uterine Fundus: firm Incision: NA DVT Evaluation: No evidence of DVT seen on physical exam.  Recent Labs    11/30/17 0549  HGB 10.5*  HCT 34.9*    Assessment/Plan: Breastfeeding PPH with total blood loss at   870cc-respond to manual extraction of cervical clot and bimanual massage. Patient stable with little to no bleeding noted now. <MEASUREMECastle Ambulatory Surgery Center LLCNT>MarESprDarrKentuckyic50k Hardie LorAdams County Regional MeMervyn SkeetersdSc<MEAS74U<MEASUREMENTVidant MedicDarrKentuckyic63k Hardie LorJefferson StratfMervyn SkeetersoSc<MEASUREMENTAbrDarrKentuckyic10k Hardie LorKindred HospMervyn SkeetersiSc<MEASUREMENTNoDarrKentuckyic48k Hardie LorOtto Kaiser MemorMervyn SkeetersiSc<MEASUREMENTMedinasummit Ambulatory Surgery Center>etMDarrKentuckyic40k Hardie LorUrology Of Central PennMervyn SkeeterssSc<MEASUREMENTRiDarrKentuckyic108k Hardie LorBrooke Glen BehavioMervyn SkeetersrSc<MEASUREMENTP[facility]ic[facility] LDarrKentuckyic72k Hardie LorMaryville Mervyn SkeetersISc<MEASUREMENTLindenhurst SurDarrKentuckyic16k Hardie LorInova Ambulatory Surgery Center Mervyn SkeeterstSc<MEASUREMENTCh[facility] Gardens [facility] LLC>DarrKentuckyic68k Hardie LorNorthcrest MeMervyn SkeetersdSc<MEASUREMENTPDarrKentuckyic68k Hardie LorMayo Clinic HeMervyn SkeetersaSc<MEASUREMENTGood [facility]aritan MeDarrKentuckyic61k Hardie LorSheppard Pratt At EMervyn SkeeterslSc<MEASUREMENTMass[facility]>etDarrKentuckyic50k Hardie LorAtlantic Coastal SuMervyn SkeetersrSc<MEASUREMENTChrist [facility]>etMarEStEncompass HDarrKentuckyic38k Hardie LorAscension St JosMervyn SkeeterseSc<MEASUREMENTUpmc Passavant-CranbDarrKentuckyic66k Hardie LorLakeview Regional MeMervyn SkeetersdSc<MEASUREMENTCrest[facility]iatric HeDarrKentuckyic56k Hardie LorGamma SuMervyn SkeetersrSc<MEASUREMENTMorledge FamilyDarrKentuckyic21k Hardie LorAlliance CommunMeDarrick Pe17Digest[facility] MounMervyn SkeeterstSc<MEASUREMENDarrKentuckyic55k Hardie LorUhs HartgrMervyn SkeetersoSc<MEASUKentuck28yRHar<MEASUREMENTBloomingtonDarrKentuckyic30k HardieMervyn Skeeters [place]<MEASUREMENTBrightoDarrKentuckyic2k Hardie LorOak And Main SurMervyn SkeetersgSc<MEASUREMENTO[facility]>etMarNational PDarrKentuckyic76k Hardie LorCoral Gables SuMervyn SkeetersrScarletGerri Sporey Center LLC Dba South Central EndoscoKentuckypy 

## 2017-11-30 NOTE — H&P (Signed)
Obstetric History and Physical  Kimberly Salinas is a 27 y.o. [id number] with IUP at 6976w1d presenting with uterine contractions. Patient states she has been having regular contractions, none vaginal bleeding, intact membranes, with active fetal movement.    Denies difficulty breathing or respiratory distress, chest pain, dysuria, and leg pain or swelling.   Prenatal Course  Source of Care: [facility]-Transfer of care at 30 weeks, total visits:  5  Pregnancy complications or risks: Anemia requiring iron infusions  Prenatal labs and studies:  ABO, Rh: --/--/PENDING (11/18 0631)  Antibody: PENDING (11/18 0631)  Rubella: 2.02 (05/24 0955)  RPR: Non Reactive (08/28 0858)   HBsAg: Negative (05/24 0955)   HIV: Non Reactive (08/28 0858)   AVW:UJWJXBJYGBS:Negative (10/23 1655)  2 hr Glucose Tolerance: 85, 127, 91  Genetic screening: AFP normal (06/21 1000)  Anatomy US: Complete, normal (06/17 1232)  Past Medical History:  Diagnosis Date  . Anemia   . No pertinent past medical history     Past Surgical History:  Procedure Laterality Date  . NO PAST SURGERIES      OB History  Gravida Para Term Preterm AB Living  3 2 2     2   SAB TAB Ectopic Multiple Live Births          2    # Outcome Date GA Lbr Len/2nd Weight Sex Delivery Anes PTL Lv  3 Current           2 Term 10/06/12    M Vag-Spont   LIV  1 Term 04/12/11 5249w4d 28:45 / 03:00 3700 g F Vag-Spont EPI  LIV    Social History   Socioeconomic History  . Marital status: Single    Spouse name: Not on file  . Number of children: 2  . Years of education: Not on file  . Highest education level: Not on file  Occupational History  . Not on file  Social Needs  . Financial resource strain: Not on file  . Food insecurity:    Worry: Not on file    Inability: Not on file  . Transportation needs:    Medical: Not on file    Non-medical: Not on file  Tobacco Use  . Smoking status: Never Smoker  . Smokeless tobacco: Never Used   Substance and Sexual Activity  . Alcohol use: No  . Drug use: No  . Sexual activity: Yes    Birth control/protection: None  Lifestyle  . Physical activity:    Days per week: Not on file    Minutes per session: Not on file  . Stress: Not on file  Relationships  . Social connections:    Talks on phone: Not on file    Gets together: Not on file    Attends religious service: Not on file    Active member of club or organization: Not on file    Attends meetings of clubs or organizations: Not on file    Relationship status: Not on file  Other Topics Concern  . Not on file  Social History Narrative  . Not on file    Family History  Problem Relation Age of Onset  . Diabetes Paternal Aunt   . Hypertension Paternal Aunt   . Diabetes Paternal Uncle   . Hypertension Paternal Uncle   . Anemia Mother   . Cancer Paternal Grandfather        Unknown type   . Anesthesia problems Neg Hx   . Hypotension Neg Hx   .  Malignant hyperthermia Neg Hx   . Pseudochol deficiency Neg Hx     Medications Prior to Admission  Medication Sig Dispense Refill Last Dose  . Prenatal Vit-Fe Fumarate-FA (PREPLUS) 27-1 MG TABS Take 1 tablet by mouth daily. 30 tablet 13 11/29/2017 at Unknown time    No Known Allergies  Review of Systems: Negative except for what is mentioned in HPI.  Physical Exam:  Temp:  [97.6 F (36.4 C)-98.2 F (36.8 C)] 98.2 F (36.8 C) (11/18 0715) Pulse Rate:  [97-114] 97 (11/18 0715) Resp:  [18] 18 (11/18 0715) BP: (125-126)/(63-72) 125/63 (11/18 0715) Weight:  [83 kg] 83 kg (11/18 0509)  GENERAL: Well-developed, well-nourished female in no acute distress.   LUNGS: Clear to auscultation bilaterally.   HEART: Regular rate and rhythm.  ABDOMEN: Soft, nontender, nondistended, gravid.  EXTREMITIES: Nontender, no edema, 2+ distal pulses.  Cervical Exam: Dilation: 8 Effacement (%): 50 Cervical Position: Anterior Station: -1 Presentation: Vertex Exam by:: Arta Silence,  RN  FHT:  Baseline rate 150 bpm   Variability moderate  Accelerations present   Decelerations none  Contractions: Every two (2) minutes, soft resting tone   Pertinent Labs/Studies:   Results for orders placed or performed during the hospital encounter of 11/30/17 (from the past 24 hour(s))  CBC     Status: Abnormal   Collection Time: 11/30/17  5:49 AM  Result Value Ref Range   WBC 10.8 (H) 4.0 - 10.5 K/uL   RBC 4.24 3.87 - 5.11 MIL/uL   Hemoglobin 10.5 (L) 12.0 - 15.0 g/dL   HCT 16.1 (L) 09.6 - 04.5 %   MCV 82.3 80.0 - 100.0 fL   MCH 24.8 (L) 26.0 - 34.0 pg   MCHC 30.1 30.0 - 36.0 g/dL   RDW 40.9 (H) 81.1 - 91.4 %   Platelets 241 150 - 400 K/uL   nRBC 0.3 (H) 0.0 - 0.2 %  Type and screen     Status: None (Preliminary result)   Collection Time: 11/30/17  6:31 AM  Result Value Ref Range   ABO/RH(D) PENDING    Antibody Screen PENDING    Sample Expiration      12/03/2017 Performed at [facility], [address] [place], [place] [address]     Assessment :  Kimberly Salinas is a 27 y.o. [phone] at [redacted]w[redacted]d being admitted for labor, Rh positive, Anemia requiring iron infusions, GBS negative  FHR Category I  Plan:  Admit to birthing suites, see orders.   Induction/Augmentation as needed, per protocol.  Delivery plan: Hopeful for vaginal delivery.   Dr. Valentino Saxon notified of admission and plan of care.    Gunnar Bulla, CNM [facility], [facility] 11/30/17 8:29 AM

## 2017-11-30 NOTE — Anesthesia Procedure Notes (Signed)
Epidural Patient location during procedure: OB  Staffing Anesthesiologist: Jovita GammaFitzgerald, Kathryn L, MD Resident/CRNA: Mathews ArgyleLogan, Laylah Riga, CRNA Performed: resident/CRNA   Preanesthetic Checklist Completed: patient identified, site marked, surgical consent, pre-op evaluation, timeout performed, IV checked, risks and benefits discussed and monitors and equipment checked  Epidural Patient position: sitting Prep: Betadine Patient monitoring: heart rate, continuous pulse ox and blood pressure Approach: midline Location: L4-L5 Injection technique: LOR saline  Needle:  Needle type: Tuohy  Needle gauge: 17 G Needle length: 9 cm Needle insertion depth: 8 cm Catheter type: closed end flexible Catheter size: 19 Gauge Catheter at skin depth: 13 cm Test dose: negative and 1.5% lidocaine with Epi 1:200 K  Assessment Events: blood not aspirated, injection not painful, no injection resistance, negative IV test and no paresthesia  Additional Notes   Patient tolerated the insertion well without complications.Reason for block:procedure for pain

## 2017-11-30 NOTE — Discharge Instructions (Signed)
Labor Induction Labor induction is when steps are taken to cause a pregnant woman to begin the labor process. Most women go into labor on their own between 37 weeks and 42 weeks of the pregnancy. When this does not happen or when there is a medical need, methods may be used to induce labor. Labor induction causes a pregnant woman's uterus to contract. It also causes the cervix to soften (ripen), open (dilate), and thin out (efface). Usually, labor is not induced before 39 weeks of the pregnancy unless there is a problem with the baby or mother. Before inducing labor, your health care provider will consider a number of factors, including the following:  The medical condition of you and the baby.  How many weeks along you are.  The status of the baby's lung maturity.  The condition of the cervix.  The position of the baby. What are the reasons for labor induction? Labor may be induced for the following reasons:  The health of the baby or mother is at risk.  The pregnancy is overdue by 1 week or more.  The water breaks but labor does not start on its own.  The mother has a health condition or serious illness, such as high blood pressure, infection, placental abruption, or diabetes.  The amniotic fluid amounts are low around the baby.  The baby is distressed. Convenience or wanting the baby to be born on a certain date is not a reason for inducing labor. What methods are used for labor induction? Several methods of labor induction may be used, such as:  Prostaglandin medicine. This medicine causes the cervix to dilate and ripen. The medicine will also start contractions. It can be taken by mouth or by inserting a suppository into the vagina.  Inserting a thin tube (catheter) with a balloon on the end into the vagina to dilate the cervix. Once inserted, the balloon is expanded with water, which causes the cervix to open.  Stripping the membranes. Your health care provider separates  amniotic sac tissue from the cervix, causing the cervix to be stretched and causing the release of a hormone called progesterone. This may cause the uterus to contract. It is often done during an office visit. You will be sent home to wait for the contractions to begin. You will then come in for an induction.  Breaking the water. Your health care provider makes a hole in the amniotic sac using a small instrument. Once the amniotic sac breaks, contractions should begin. This may still take hours to see an effect.  Medicine to trigger or strengthen contractions. This medicine is given through an IV access tube inserted into a vein in your arm. All of the methods of induction, besides stripping the membranes, will be done in the hospital. Induction is done in the hospital so that you and the baby can be carefully monitored. How long does it take for labor to be induced? Some inductions can take up to 2-3 days. Depending on the cervix, it usually takes less time. It takes longer when you are induced early in the pregnancy or if this is your first pregnancy. If a mother is still pregnant and the induction has been going on for 2-3 days, either the mother will be sent home or a cesarean delivery will be needed. What are the risks associated with labor induction? Some of the risks of induction include:  Changes in fetal heart rate, such as too high, too low, or erratic.  Fetal distress.    Chance of infection for the mother and baby.  Increased chance of having a cesarean delivery.  Breaking off (abruption) of the placenta from the uterus (rare).  Uterine rupture (very rare). When induction is needed for medical reasons, the benefits of induction may outweigh the risks. What are some reasons for not inducing labor? Labor induction should not be done if:  It is shown that your baby does not tolerate labor.  You have had previous surgeries on your uterus, such as a myomectomy or the removal of  fibroids.  Your placenta lies very low in the uterus and blocks the opening of the cervix (placenta previa).  Your baby is not in a head-down position.  The umbilical cord drops down into the birth canal in front of the baby. This could cut off the baby's blood and oxygen supply.  You have had a previous cesarean delivery.  There are unusual circumstances, such as the baby being extremely premature. This information is not intended to replace advice given to you by your health care provider. Make sure you discuss any questions you have with your health care provider. Document Released: 05/21/2006 Document Revised: 06/07/2015 Document Reviewed: 07/29/2012 Elsevier Interactive Patient Education  2017 [employer]  

## 2017-11-30 NOTE — Anesthesia Preprocedure Evaluation (Signed)
Anesthesia Evaluation  Patient identified by MRN, date of birth, ID band Patient awake    Reviewed: Allergy & Precautions, H&P , Patient's Chart, lab work & pertinent test results  Airway Mallampati: III  TM Distance: >3 FB Neck ROM: full    Dental  (+) Dental Advidsory Given, Chipped   Pulmonary neg pulmonary ROS,    Pulmonary exam normal        Cardiovascular negative cardio ROS       Neuro/Psych negative neurological ROS  negative psych ROS   GI/Hepatic Neg liver ROS, GERD  ,  Endo/Other  negative endocrine ROS  Renal/GU negative Renal ROS     Musculoskeletal   Abdominal   Peds  Hematology   Anesthesia Other Findings   Reproductive/Obstetrics (+) Pregnancy                             Anesthesia Physical Anesthesia Plan  ASA: II  Anesthesia Plan: Epidural   Post-op Pain Management:    Induction:   PONV Risk Score and Plan:   Airway Management Planned:   Additional Equipment:   Intra-op Plan:   Post-operative Plan:   Informed Consent: I have reviewed the patients History and Physical, chart, labs and discussed the procedure including the risks, benefits and alternatives for the proposed anesthesia with the patient or authorized representative who has indicated his/her understanding and acceptance.     Plan Discussed with: CRNA and Anesthesiologist  Anesthesia Plan Comments:         Anesthesia Quick Evaluation

## 2017-11-30 NOTE — Lactation Note (Signed)
This note was copied from a baby's chart. Lactation Consultation Note  Patient Name: Kimberly Salinas Today's Date: 11/30/2017 Reason for consult: Initial assessment   Maternal Data Has patient been taught Hand Expression?: No Does the patient have breastfeeding experience prior to this delivery?: Yes  Feeding    LATCH Score                   Interventions    Lactation Tools Discussed/Used     Consult Status Consult Status: Follow-up Date: 11/30/17 Follow-up type: In-patient LC spoke with parents about physiology of breastfeeding because mother was afraid that she didn't have any "milk" to feed the baby. LC offered to assist baby with first feeding because mother said she pumped exclusively for her first two children due to difficulties with latching. Mom declined help and asked to formula feed. LC told parents she would bring them them a hand pump later in the day.    Burnadette PeterJaniya M Rachard Isidro 11/30/2017, 10:14 AM

## 2017-11-30 NOTE — Plan of Care (Signed)
  Problem: Activity: ?Goal: Will verbalize the importance of balancing activity with adequate rest periods ?Outcome: Progressing ?  ?Problem: Activity: ?Goal: Ability to tolerate increased activity will improve ?Outcome: Progressing ?  ?Problem: Coping: ?Goal: Ability to identify and utilize available resources and services will improve ?Outcome: Progressing ?  ?

## 2017-12-01 ENCOUNTER — Other Ambulatory Visit: Payer: Self-pay | Admitting: Obstetrics and Gynecology

## 2017-12-01 DIAGNOSIS — O26849 Uterine size-date discrepancy, unspecified trimester: Secondary | ICD-10-CM

## 2017-12-01 LAB — CBC
HCT: 29.6 % — ABNORMAL LOW (ref 36.0–46.0)
Hemoglobin: 8.7 g/dL — ABNORMAL LOW (ref 12.0–15.0)
MCH: 25.2 pg — ABNORMAL LOW (ref 26.0–34.0)
MCHC: 29.4 g/dL — AB (ref 30.0–36.0)
MCV: 85.8 fL (ref 80.0–100.0)
PLATELETS: 186 10*3/uL (ref 150–400)
RBC: 3.45 MIL/uL — ABNORMAL LOW (ref 3.87–5.11)
RDW: 23.3 % — AB (ref 11.5–15.5)
WBC: 8.3 10*3/uL (ref 4.0–10.5)
nRBC: 0 % (ref 0.0–0.2)

## 2017-12-01 LAB — RPR: RPR Ser Ql: NONREACTIVE

## 2017-12-01 MED ORDER — IBUPROFEN 600 MG PO TABS: 600.0000 mg | ORAL_TABLET | Freq: Four times a day (QID) | ORAL | 0 refills | Status: DC

## 2017-12-01 MED ORDER — DOCUSATE SODIUM 100 MG PO CAPS: 100.0000 mg | ORAL_CAPSULE | Freq: Two times a day (BID) | ORAL | 0 refills | Status: DC

## 2017-12-01 NOTE — Progress Notes (Signed)
Pt discharged with infant.  Discharge instructions, prescriptions and follow up appointment given to and reviewed with pt. Pt verbalized understanding. Escorted out by auxillary. 

## 2017-12-01 NOTE — Discharge Summary (Signed)
Physician Obstetric Discharge Summary  Patient ID: Tenna Childaris Harley Remillard MRN: [id number] DOB/AGE: 03/06/1990 27 y.o.   Date of Admission: 11/30/2017  Date of Discharge:   Admitting Diagnosis: Onset of Labor at 6693w1d  Secondary Diagnosis: Anemia in pregnancy  Mode of Delivery: normal spontaneous vaginal delivery     Discharge Diagnosis: No other diagnosis   Intrapartum Procedures: epidural   Post partum procedures: none  Complications: none   Brief Hospital Course  Nikoleta Quincy SheehanHarley Swango is a [id number] who had a SVD on 11/30/17;  for further details of this , please refer to the delivey note.  Patient had an uncomplicated postpartum course.  By time of discharge on PPD#1, her pain was controlled on oral pain medications; she had appropriate lochia and was ambulating, voiding without difficulty and tolerating regular diet.  She was deemed stable for discharge to home.       Labs: CBC Latest Ref Rng & Units 12/01/2017 11/30/2017 11/03/2017  WBC 4.0 - 10.5 K/uL 8.3 10.8(H) 6.5  Hemoglobin 12.0 - 15.0 g/dL 1.4(N8.7(L) 10.5(L) 7.9(L)  Hematocrit 36.0 - 46.0 % 29.6(L) 34.9(L) 26.0(L)  Platelets 150 - 400 K/uL 186 241 212   A POS  Physical exam:  Blood pressure 105/60, pulse 77, temperature 97.8 F (36.6 C), temperature source Oral, resp. rate 18, height 5\' 4"  (1.626 m), weight 83 kg, last menstrual period 02/22/2017, SpO2 100 %, unknown if currently breastfeeding. General: alert and no distress Lochia: appropriate Abdomen: soft, NT Uterine Fundus: firm Extremities: No evidence of DVT seen on physical exam. No lower extremity edema.  Discharge Instructions: Per After Visit Summary. Activity: Advance as tolerated. Pelvic rest for 6 weeks.  Also refer to After Visit Summary Diet: Regular Medications: Allergies as of 12/01/2017   No Known Allergies     Medication List    TAKE these medications   docusate sodium 100 MG capsule Commonly known as:  COLACE Take 1 capsule (100 mg  total) by mouth 2 (two) times daily.   ibuprofen 600 MG tablet Commonly known as:  ADVIL,MOTRIN Take 1 tablet (600 mg total) by mouth every 6 (six) hours.   PREPLUS 27-1 MG Tabs Take 1 tablet by mouth daily.      Outpatient follow up:  Postpartum contraception: no method  Discharged Condition: good  Discharged to: home  To schedule iron infusion in 3 days   Newborn Data: Disposition:home with mother  Apgars: APGAR (1 MIN): 9   APGAR (5 MINS): 9   APGAR (10 MINS):    Baby Feeding: Breast  Coumba Kellison Suzan NailerN Alyah Boehning, CNM

## 2017-12-01 NOTE — Discharge Summary (Deleted)
  The note originally documented on this encounter has been moved the the encounter in which it belongs.  

## 2017-12-01 NOTE — Anesthesia Postprocedure Evaluation (Signed)
Anesthesia Post Note  Patient: Kimberly Salinas  Procedure(s) Performed: AN AD HOC LABOR EPIDURAL  Patient location during evaluation: Mother Baby Anesthesia Type: Epidural Level of consciousness: awake, awake and alert and oriented Pain management: pain level controlled Vital Signs Assessment: post-procedure vital signs reviewed and stable Respiratory status: spontaneous breathing, nonlabored ventilation and respiratory function stable Cardiovascular status: blood pressure returned to baseline and stable Postop Assessment: no headache, no backache, patient able to bend at knees, no apparent nausea or vomiting, adequate PO intake and able to ambulate Anesthetic complications: no     Last Vitals:  Vitals:   12/01/17 0400 12/01/17 0820  BP: 107/71 105/60  Pulse: 77 77  Resp: 18 18  Temp: 36.5 C 36.6 C  SpO2: 100% 100%    Last Pain:  Vitals:   12/01/17 0844  TempSrc:   PainSc: 0-No pain                 Lashika Erker,  Nykerria Macconnell R

## 2017-12-02 ENCOUNTER — Encounter: Payer: Medicaid Other | Admitting: Obstetrics and Gynecology

## 2017-12-02 ENCOUNTER — Other Ambulatory Visit: Payer: Medicaid Other

## 2018-01-01 ENCOUNTER — Other Ambulatory Visit: Payer: Self-pay

## 2018-01-01 ENCOUNTER — Inpatient Hospital Stay: Payer: Medicaid Other | Attending: Oncology

## 2018-01-01 DIAGNOSIS — D508 Other iron deficiency anemias: Secondary | ICD-10-CM

## 2018-01-01 DIAGNOSIS — D500 Iron deficiency anemia secondary to blood loss (chronic): Secondary | ICD-10-CM | POA: Insufficient documentation

## 2018-01-01 DIAGNOSIS — N920 Excessive and frequent menstruation with regular cycle: Secondary | ICD-10-CM | POA: Insufficient documentation

## 2018-01-01 LAB — CBC WITH DIFFERENTIAL/PLATELET
Abs Immature Granulocytes: 0.01 10*3/uL (ref 0.00–0.07)
BASOS ABS: 0 10*3/uL (ref 0.0–0.1)
Basophils Relative: 0 %
Eosinophils Absolute: 0.1 10*3/uL (ref 0.0–0.5)
Eosinophils Relative: 3 %
HEMATOCRIT: 38.6 % (ref 36.0–46.0)
Hemoglobin: 12.1 g/dL (ref 12.0–15.0)
IMMATURE GRANULOCYTES: 0 %
LYMPHS ABS: 1.9 10*3/uL (ref 0.7–4.0)
Lymphocytes Relative: 47 %
MCH: 25.8 pg — ABNORMAL LOW (ref 26.0–34.0)
MCHC: 31.3 g/dL (ref 30.0–36.0)
MCV: 82.3 fL (ref 80.0–100.0)
Monocytes Absolute: 0.3 10*3/uL (ref 0.1–1.0)
Monocytes Relative: 8 %
NEUTROS PCT: 42 %
NRBC: 0 % (ref 0.0–0.2)
Neutro Abs: 1.7 10*3/uL (ref 1.7–7.7)
PLATELETS: 186 10*3/uL (ref 150–400)
RBC: 4.69 MIL/uL (ref 3.87–5.11)
RDW: 19.9 % — AB (ref 11.5–15.5)
WBC: 4 10*3/uL (ref 4.0–10.5)

## 2018-01-01 LAB — IRON AND TIBC
Iron: 53 ug/dL (ref 28–170)
Saturation Ratios: 14 % (ref 10.4–31.8)
TIBC: 386 ug/dL (ref 250–450)
UIBC: 333 ug/dL

## 2018-01-01 LAB — FERRITIN: Ferritin: 40 ng/mL (ref 11–307)

## 2018-01-11 ENCOUNTER — Inpatient Hospital Stay: Payer: Medicaid Other

## 2018-01-11 ENCOUNTER — Inpatient Hospital Stay: Payer: Medicaid Other | Admitting: Oncology

## 2018-01-11 ENCOUNTER — Encounter: Payer: Medicaid Other | Admitting: Certified Nurse Midwife

## 2018-01-21 ENCOUNTER — Encounter: Payer: Medicaid Other | Admitting: Certified Nurse Midwife

## 2018-02-10 ENCOUNTER — Encounter: Payer: Self-pay | Admitting: Oncology

## 2018-02-10 ENCOUNTER — Other Ambulatory Visit: Payer: Self-pay

## 2018-02-10 ENCOUNTER — Inpatient Hospital Stay: Payer: Medicaid Other | Attending: Oncology | Admitting: Oncology

## 2018-02-10 ENCOUNTER — Inpatient Hospital Stay: Payer: Medicaid Other

## 2018-02-10 VITALS — BP 100/69 | HR 62 | Temp 96.9°F | Resp 18 | Wt 156.1 lb

## 2018-02-10 VITALS — BP 109/71 | HR 60 | Resp 18

## 2018-02-10 DIAGNOSIS — D500 Iron deficiency anemia secondary to blood loss (chronic): Secondary | ICD-10-CM

## 2018-02-10 DIAGNOSIS — D508 Other iron deficiency anemias: Secondary | ICD-10-CM

## 2018-02-10 DIAGNOSIS — R5383 Other fatigue: Secondary | ICD-10-CM | POA: Diagnosis not present

## 2018-02-10 DIAGNOSIS — Z791 Long term (current) use of non-steroidal anti-inflammatories (NSAID): Secondary | ICD-10-CM | POA: Insufficient documentation

## 2018-02-10 DIAGNOSIS — N920 Excessive and frequent menstruation with regular cycle: Secondary | ICD-10-CM | POA: Insufficient documentation

## 2018-02-10 DIAGNOSIS — E538 Deficiency of other specified B group vitamins: Secondary | ICD-10-CM | POA: Diagnosis not present

## 2018-02-10 DIAGNOSIS — R5381 Other malaise: Secondary | ICD-10-CM | POA: Diagnosis not present

## 2018-02-10 DIAGNOSIS — Z79899 Other long term (current) drug therapy: Secondary | ICD-10-CM | POA: Diagnosis not present

## 2018-02-10 MED ORDER — FERROUS SULFATE 325 (65 FE) MG PO TBEC: 325.0000 mg | DELAYED_RELEASE_TABLET | Freq: Two times a day (BID) | ORAL | 3 refills | Status: DC

## 2018-02-10 MED ORDER — IRON SUCROSE 20 MG/ML IV SOLN
200.0000 mg | Freq: Once | INTRAVENOUS | Status: AC
  Administered 2018-02-10: 200 mg via INTRAVENOUS
  Filled 2018-02-10: qty 10

## 2018-02-10 MED ORDER — SODIUM CHLORIDE 0.9 % IV SOLN
Freq: Once | INTRAVENOUS | Status: AC
  Administered 2018-02-10: 15:00:00 via INTRAVENOUS
  Filled 2018-02-10: qty 250

## 2018-02-10 NOTE — Progress Notes (Signed)
Patient here for follow up. States she stays tired most of the time, she is unsure if it because of the baby or low iron or a mixture of both. Pt states that periods are heavier now and they last about 1 week at a time.

## 2018-02-11 NOTE — Progress Notes (Signed)
Hematology/Oncology Consult note [facility] Telephone:([phone] [phone]   Patient Care Team: Patient, No Pcp Per as PCP - General (General Practice)  REFERRING PROVIDER: OB/GYN  Doreene Burke CHIEF COMPLAINTS/REASON FOR VISIT:  Evaluation of anemia in pregnancy  HISTORY OF PRESENTING ILLNESS:  Kimberly Salinas is a  28 y.o.  female with PMH listed below who was referred to me for evaluation of anemia in pregnancy Reviewed patient's recent labs that was done done on 10/21/2017. Labs revealed anemia with hemoglobin of 7.5, MCV 78, normal platelet level and WBC.  No differential was obtained.  Ferritin was decreased at 4, on 09/24/2017.  Patient reports that he has been started on iron supplements with fusion plus orally.  She got about 2 weeks supply and did not continue due to the cost. Reviewed patient's previous labs , anemia is chronic onset , duration is since at least 2013. No aggravating or improving factors.  Associated signs and symptoms: Patient reports fatigue.  Denies SOB with exertion.  This is her third pregnancy. Denies weight loss, easy bruising, hematochezia, hemoptysis, hematuria. Context: History of GI bleeding: Denies                                           Last colonoscopy: Not available               History of heavy menstrual period.  Yes  INTERVAL HISTORY Kimberly Salinas is a 28 y.o. female who has above history reviewed by me today presents for follow up visit for management of anemia. Problems and complaints are listed below: Reports feeling more fatigued and tired recently.  She is not sure if this is because of her baby or low iron state.  Menstrual periods are heavier now in the last for about a week   Review of Systems  Constitutional: Positive for malaise/fatigue. Negative for chills and fever.  HENT: Negative for nosebleeds and sore throat.   Eyes: Negative for double vision, photophobia and redness.    Respiratory: Negative for cough, shortness of breath and wheezing.   Cardiovascular: Negative for chest pain, palpitations and orthopnea.  Gastrointestinal: Negative for abdominal pain, blood in stool, nausea and vomiting.  Genitourinary: Negative for dysuria.  Musculoskeletal: Negative for back pain, myalgias and neck pain.  Skin: Negative for itching and rash.  Neurological: Negative for dizziness, tingling and tremors.  Endo/Heme/Allergies: Negative for environmental allergies. Does not bruise/bleed easily.  Psychiatric/Behavioral: Negative for depression.    MEDICAL HISTORY:  Past Medical History:  Diagnosis Date  . Anemia   . No pertinent past medical history     SURGICAL HISTORY: Past Surgical History:  Procedure Laterality Date  . NO PAST SURGERIES      SOCIAL HISTORY: Social History   Socioeconomic History  . Marital status: Single    Spouse name: Not on file  . Number of children: 2  . Years of education: Not on file  . Highest education level: Not on file  Occupational History  . Not on file  Social Needs  . Financial resource strain: Not on file  . Food insecurity:    Worry: Not on file    Inability: Not on file  . Transportation needs:    Medical: Not on file    Non-medical: Not on file  Tobacco Use  . Smoking status: Never Smoker  . Smokeless  tobacco: Never Used  Substance and Sexual Activity  . Alcohol use: No  . Drug use: No  . Sexual activity: Yes    Birth control/protection: None  Lifestyle  . Physical activity:    Days per week: Not on file    Minutes per session: Not on file  . Stress: Not on file  Relationships  . Social connections:    Talks on phone: Not on file    Gets together: Not on file    Attends religious service: Not on file    Active member of club or organization: Not on file    Attends meetings of clubs or organizations: Not on file    Relationship status: Not on file  . Intimate partner violence:    Fear of current  or ex partner: Not on file    Emotionally abused: Not on file    Physically abused: Not on file    Forced sexual activity: Not on file  Other Topics Concern  . Not on file  Social History Narrative  . Not on file    FAMILY HISTORY: Family History  Problem Relation Age of Onset  . Diabetes Paternal Aunt   . Hypertension Paternal Aunt   . Diabetes Paternal Uncle   . Hypertension Paternal Uncle   . Anemia Mother   . Cancer Paternal Grandfather        Unknown type   . Anesthesia problems Neg Hx   . Hypotension Neg Hx   . Malignant hyperthermia Neg Hx   . Pseudochol deficiency Neg Hx     ALLERGIES:  has No Known Allergies.  MEDICATIONS:  Current Outpatient Medications  Medication Sig Dispense Refill  . ibuprofen (ADVIL,MOTRIN) 600 MG tablet Take 1 tablet (600 mg total) by mouth every 6 (six) hours. 30 tablet 0  . docusate sodium (COLACE) 100 MG capsule Take 1 capsule (100 mg total) by mouth 2 (two) times daily. (Patient not taking: Reported on 02/10/2018) 10 capsule 0  . ferrous sulfate 325 (65 FE) MG EC tablet Take 1 tablet (325 mg total) by mouth 2 (two) times daily. 60 tablet 3  . Prenatal Vit-Fe Fumarate-FA (PREPLUS) 27-1 MG TABS Take 1 tablet by mouth daily. (Patient not taking: Reported on 02/10/2018) 30 tablet 13   No current facility-administered medications for this visit.      PHYSICAL EXAMINATION: ECOG PERFORMANCE STATUS: 1 - Symptomatic but completely ambulatory Vitals:   02/10/18 1346  BP: 100/69  Pulse: 62  Resp: 18  Temp: (!) 96.9 F (36.1 C)   Filed Weights   02/10/18 1346  Weight: 156 lb 1.6 oz (70.8 kg)    Physical Exam Constitutional:      General: She is not in acute distress. HENT:     Head: Normocephalic and atraumatic.  Eyes:     General: No scleral icterus.    Pupils: Pupils are equal, round, and reactive to light.  Neck:     Musculoskeletal: Normal range of motion and neck supple.  Cardiovascular:     Rate and Rhythm: Normal rate and  regular rhythm.     Heart sounds: Normal heart sounds.  Pulmonary:     Effort: Pulmonary effort is normal. No respiratory distress.     Breath sounds: No wheezing.  Abdominal:     General: Bowel sounds are normal.     Palpations: Abdomen is soft. There is no mass.     Tenderness: There is no abdominal tenderness.     Comments: Gravidas uterus  Musculoskeletal: Normal range of motion.        General: No deformity.  Skin:    General: Skin is warm and dry.     Findings: No erythema or rash.  Neurological:     Mental Status: She is alert and oriented to person, place, and time.     Cranial Nerves: No cranial nerve deficit.     Coordination: Coordination normal.  Psychiatric:        Behavior: Behavior normal.        Thought Content: Thought content normal.      LABORATORY DATA:  I have reviewed the data as listed Lab Results  Component Value Date   WBC 4.0 01/01/2018   HGB 12.1 01/01/2018   HCT 38.6 01/01/2018   MCV 82.3 01/01/2018   PLT 186 01/01/2018   Recent Labs    11/03/17 1114  NA 136  K 4.0  CL 105  CO2 23  GLUCOSE 89  BUN 7  CREATININE 0.58  CALCIUM 8.6*  GFRNONAA >60  GFRAA >60  PROT 6.7  ALBUMIN 3.0*  AST 18  ALT 8  ALKPHOS 128*  BILITOT 0.4   Iron/TIBC/Ferritin/ %Sat    Component Value Date/Time   IRON 53 01/01/2018 1144   TIBC 386 01/01/2018 1144   FERRITIN 40 01/01/2018 1144   FERRITIN 4 (L) 09/24/2017 1420   IRONPCTSAT 14 01/01/2018 1144        ASSESSMENT & PLAN:  1. Other iron deficiency anemia   Labs are reviewed and discussed with patient. 01/01/2018, ferritin was 40, which was improved from 3 months ago. She had appointment for follow-up but have to reschedule due to other conflicts. Given that she may have lost blood during delivery and resumed menstrual., We will proceed with another dose of IV Venofer. Repeat CBC, ferritin, iron TIBC in 3 months with labs done a few days prior.  Plus minus benefit.   Orders Placed This  Encounter  Procedures  . CBC with Differential/Platelet    Standing Status:   Future    Standing Expiration Date:   02/11/2019  . Ferritin    Standing Status:   Future    Standing Expiration Date:   02/11/2019  . Iron and TIBC    Standing Status:   Future    Standing Expiration Date:   02/11/2019    All questions were answered. The patient knows to call the clinic with any problems questions or concerns.  Return of visit: 12 weeks  Rickard PatienceZhou Damian Buckles, MD, PhD Hematology Oncology [facility] Pager- [phone] 02/11/2018

## 2018-05-09 ENCOUNTER — Other Ambulatory Visit: Payer: Self-pay

## 2018-05-10 ENCOUNTER — Inpatient Hospital Stay: Payer: Medicaid Other | Attending: Oncology

## 2018-05-10 ENCOUNTER — Other Ambulatory Visit: Payer: Self-pay

## 2018-05-10 DIAGNOSIS — R5383 Other fatigue: Secondary | ICD-10-CM | POA: Insufficient documentation

## 2018-05-10 DIAGNOSIS — Z791 Long term (current) use of non-steroidal anti-inflammatories (NSAID): Secondary | ICD-10-CM | POA: Diagnosis not present

## 2018-05-10 DIAGNOSIS — N920 Excessive and frequent menstruation with regular cycle: Secondary | ICD-10-CM | POA: Insufficient documentation

## 2018-05-10 DIAGNOSIS — E538 Deficiency of other specified B group vitamins: Secondary | ICD-10-CM | POA: Insufficient documentation

## 2018-05-10 DIAGNOSIS — F411 Generalized anxiety disorder: Secondary | ICD-10-CM | POA: Diagnosis not present

## 2018-05-10 DIAGNOSIS — D500 Iron deficiency anemia secondary to blood loss (chronic): Secondary | ICD-10-CM | POA: Insufficient documentation

## 2018-05-10 DIAGNOSIS — D508 Other iron deficiency anemias: Secondary | ICD-10-CM

## 2018-05-10 DIAGNOSIS — Z79899 Other long term (current) drug therapy: Secondary | ICD-10-CM | POA: Insufficient documentation

## 2018-05-10 DIAGNOSIS — Z9884 Bariatric surgery status: Secondary | ICD-10-CM | POA: Diagnosis not present

## 2018-05-10 LAB — IRON AND TIBC
Iron: 47 ug/dL (ref 28–170)
Saturation Ratios: 11 % (ref 10.4–31.8)
TIBC: 432 ug/dL (ref 250–450)
UIBC: 385 ug/dL

## 2018-05-10 LAB — CBC WITH DIFFERENTIAL/PLATELET
Abs Immature Granulocytes: 0 10*3/uL (ref 0.00–0.07)
Basophils Absolute: 0 10*3/uL (ref 0.0–0.1)
Basophils Relative: 0 %
Eosinophils Absolute: 0.1 10*3/uL (ref 0.0–0.5)
Eosinophils Relative: 3 %
HCT: 37 % (ref 36.0–46.0)
Hemoglobin: 12.3 g/dL (ref 12.0–15.0)
Immature Granulocytes: 0 %
Lymphocytes Relative: 38 %
Lymphs Abs: 1.8 10*3/uL (ref 0.7–4.0)
MCH: 29.1 pg (ref 26.0–34.0)
MCHC: 33.2 g/dL (ref 30.0–36.0)
MCV: 87.7 fL (ref 80.0–100.0)
Monocytes Absolute: 0.4 10*3/uL (ref 0.1–1.0)
Monocytes Relative: 7 %
Neutro Abs: 2.5 10*3/uL (ref 1.7–7.7)
Neutrophils Relative %: 52 %
Platelets: 234 10*3/uL (ref 150–400)
RBC: 4.22 MIL/uL (ref 3.87–5.11)
RDW: 12.5 % (ref 11.5–15.5)
WBC: 4.9 10*3/uL (ref 4.0–10.5)
nRBC: 0 % (ref 0.0–0.2)

## 2018-05-10 LAB — VITAMIN B12: Vitamin B-12: 228 pg/mL (ref 180–914)

## 2018-05-10 LAB — FERRITIN: Ferritin: 9 ng/mL — ABNORMAL LOW (ref 11–307)

## 2018-05-11 ENCOUNTER — Inpatient Hospital Stay: Payer: Medicaid Other

## 2018-05-11 ENCOUNTER — Inpatient Hospital Stay (HOSPITAL_BASED_OUTPATIENT_CLINIC_OR_DEPARTMENT_OTHER): Payer: Medicaid Other | Admitting: Oncology

## 2018-05-11 ENCOUNTER — Encounter: Payer: Self-pay | Admitting: Oncology

## 2018-05-11 DIAGNOSIS — D500 Iron deficiency anemia secondary to blood loss (chronic): Secondary | ICD-10-CM

## 2018-05-11 DIAGNOSIS — E538 Deficiency of other specified B group vitamins: Secondary | ICD-10-CM | POA: Diagnosis not present

## 2018-05-11 DIAGNOSIS — K068 Other specified disorders of gingiva and edentulous alveolar ridge: Secondary | ICD-10-CM | POA: Diagnosis not present

## 2018-05-11 DIAGNOSIS — N920 Excessive and frequent menstruation with regular cycle: Secondary | ICD-10-CM

## 2018-05-11 NOTE — Progress Notes (Signed)
Patient contacted via phone for virtual visit. States she has anxiety, but no other issues.

## 2018-05-11 NOTE — Progress Notes (Signed)
HEMATOLOGY-ONCOLOGY TeleHEALTH VISIT PROGRESS NOTE  I connected with Kimberly Salinas on 05/11/18 at 2:00 PM EDT by video enabled telemedicine visit and verified that I am speaking with the correct person using two identifiers. I discussed the limitations, risks, security and privacy concerns of performing an evaluation and management service by telemedicine and the availability of in-person appointments. I also discussed with the patient that there may be a patient responsible charge related to this service. The patient expressed understanding and agreed to proceed.   Other persons participating in the visit and their role in the encounter:  Diamantina Monks, CMA, check in patient   Merleen Nicely, RN, check in patient.   Patient's location: Home  Provider's location: home  Chief Complaint: Follow-up for iron deficiency anemia   INTERVAL HISTORY Kimberly Salinas is a 28 y.o. female who has above history reviewed by me today presents for follow up visit for management of iron deficiency anemia Problems and complaints are listed below:  Patient was last seen by me on 02/10/2018 and received IV Venofer. She reports feeling well.  Fatigue better. Still have heavy menstrual bleed, which is worse delivery. Mild gum bleeding.  No epistaxis blood in the stool or urine. Reports having otherwise no new complaints.  Review of Systems  Constitutional: Negative for appetite change, chills, fatigue and fever.  HENT:   Negative for hearing loss and voice change.   Eyes: Negative for eye problems.  Respiratory: Negative for chest tightness and cough.   Cardiovascular: Negative for chest pain.  Gastrointestinal: Negative for abdominal distention, abdominal pain and blood in stool.  Endocrine: Negative for hot flashes.  Genitourinary: Negative for difficulty urinating and frequency.   Musculoskeletal: Negative for arthralgias.  Skin: Negative for itching and rash.  Neurological: Negative  for extremity weakness.  Hematological: Negative for adenopathy.  Psychiatric/Behavioral: Negative for confusion. The patient is nervous/anxious.     Past Medical History:  Diagnosis Date  . Anemia   . No pertinent past medical history    Past Surgical History:  Procedure Laterality Date  . NO PAST SURGERIES      Family History  Problem Relation Age of Onset  . Diabetes Paternal Aunt   . Hypertension Paternal Aunt   . Diabetes Paternal Uncle   . Hypertension Paternal Uncle   . Anemia Mother   . Cancer Paternal Grandfather        Unknown type   . Anesthesia problems Neg Hx   . Hypotension Neg Hx   . Malignant hyperthermia Neg Hx   . Pseudochol deficiency Neg Hx     Social History   Socioeconomic History  . Marital status: Single    Spouse name: Not on file  . Number of children: 2  . Years of education: Not on file  . Highest education level: Not on file  Occupational History  . Not on file  Social Needs  . Financial resource strain: Not on file  . Food insecurity:    Worry: Not on file    Inability: Not on file  . Transportation needs:    Medical: Not on file    Non-medical: Not on file  Tobacco Use  . Smoking status: Never Smoker  . Smokeless tobacco: Never Used  Substance and Sexual Activity  . Alcohol use: No  . Drug use: No  . Sexual activity: Yes    Birth control/protection: None  Lifestyle  . Physical activity:    Days per week: Not on file  Minutes per session: Not on file  . Stress: Not on file  Relationships  . Social connections:    Talks on phone: Not on file    Gets together: Not on file    Attends religious service: Not on file    Active member of club or organization: Not on file    Attends meetings of clubs or organizations: Not on file    Relationship status: Not on file  . Intimate partner violence:    Fear of current or ex partner: Not on file    Emotionally abused: Not on file    Physically abused: Not on file    Forced sexual  activity: Not on file  Other Topics Concern  . Not on file  Social History Narrative  . Not on file    Current Outpatient Medications on File Prior to Visit  Medication Sig Dispense Refill  . ferrous sulfate 325 (65 FE) MG EC tablet Take 1 tablet (325 mg total) by mouth 2 (two) times daily. 60 tablet 3  . ibuprofen (ADVIL,MOTRIN) 600 MG tablet Take 1 tablet (600 mg total) by mouth every 6 (six) hours. 30 tablet 0  . docusate sodium (COLACE) 100 MG capsule Take 1 capsule (100 mg total) by mouth 2 (two) times daily. (Patient not taking: Reported on 02/10/2018) 10 capsule 0  . Prenatal Vit-Fe Fumarate-FA (PREPLUS) 27-1 MG TABS Take 1 tablet by mouth daily. (Patient not taking: Reported on 05/11/2018) 30 tablet 13   No current facility-administered medications on file prior to visit.     No Known Allergies     Observations/Objective: There were no vitals filed for this visit. There is no height or weight on file to calculate BMI.  Pain level 0 Physical Exam  Constitutional: She is oriented to person, place, and time and well-developed, well-nourished, and in no distress. No distress.  HENT:  Head: Normocephalic and atraumatic.  Pulmonary/Chest: Effort normal. No respiratory distress.  Neurological: She is alert and oriented to person, place, and time.  Psychiatric: Affect normal.    CBC    Component Value Date/Time   WBC 4.9 05/10/2018 1329   RBC 4.22 05/10/2018 1329   HGB 12.3 05/10/2018 1329   HGB 7.5 (L) 10/21/2017 0940   HCT 37.0 05/10/2018 1329   HCT 23.2 (L) 10/21/2017 0940   PLT 234 05/10/2018 1329   PLT 233 10/21/2017 0940   MCV 87.7 05/10/2018 1329   MCV 78 (L) 10/21/2017 0940   MCV 74 (L) 10/06/2013 0833   MCH 29.1 05/10/2018 1329   MCHC 33.2 05/10/2018 1329   RDW 12.5 05/10/2018 1329   RDW 15.1 10/21/2017 0940   RDW 17.9 (H) 10/06/2013 0833   LYMPHSABS 1.8 05/10/2018 1329   LYMPHSABS 1.3 06/05/2017 0955   LYMPHSABS 1.8 10/06/2013 0833   MONOABS 0.4 05/10/2018  1329   MONOABS 0.6 10/06/2013 0833   EOSABS 0.1 05/10/2018 1329   EOSABS 0.1 06/05/2017 0955   EOSABS 0.0 10/06/2013 0833   BASOSABS 0.0 05/10/2018 1329   BASOSABS 0.0 06/05/2017 0955   BASOSABS 0.0 10/06/2013 0833    CMP     Component Value Date/Time   NA 136 11/03/2017 1114   K 4.0 11/03/2017 1114   CL 105 11/03/2017 1114   CO2 23 11/03/2017 1114   GLUCOSE 89 11/03/2017 1114   BUN 7 11/03/2017 1114   CREATININE 0.58 11/03/2017 1114   CALCIUM 8.6 (L) 11/03/2017 1114   PROT 6.7 11/03/2017 1114   ALBUMIN 3.0 (L) 11/03/2017  1114   AST 18 11/03/2017 1114   ALT 8 11/03/2017 1114   ALKPHOS 128 (H) 11/03/2017 1114   BILITOT 0.4 11/03/2017 1114   GFRNONAA >60 11/03/2017 1114   GFRAA >60 11/03/2017 1114     Assessment and Plan: 1. Iron deficiency anemia due to chronic blood loss   2. Menorrhagia with regular cycle   3. Low vitamin B12 level   4. Gums, bleeding     Labs are reviewed and discussed with patient.  Ferritin is 9 duration 11, TIBC 430 2C.  She is not anemic with a hemoglobin of 12.3. Recommend proceed with 1 dose of IV Venofer.  Menorrhagia, always having heavy menstrual bleeding worse after delivery. Also mucutansous bleeding mild gum bleeding,  Will check von Willebrand panel.  History of gastric bypass, will monitor B12 and folate level periodically.   Follow Up Instructions: Venofer x1 this week Lab encounter recheck von Willebrand panel Lab MD possible Venofer in 3 months with labs being 1 to 2 days prior.     I discussed the assessment and treatment plan with the patient. The patient was provided an opportunity to ask questions and all were answered. The patient agreed with the plan and demonstrated an understanding of the instructions.  The patient was advised to call back or seek an in-person evaluation if the symptoms worsen or if the condition fails to improve as anticipated.   I provided 15 minutes of face-to-face video visit time during this  encounter, and > 50% was spent counseling as documented under my assessment & plan.  Rickard PatienceZhou Mikaia Janvier, MD 05/11/2018 11:11 PM

## 2018-05-18 ENCOUNTER — Inpatient Hospital Stay: Payer: Medicaid Other | Attending: Oncology

## 2018-05-18 ENCOUNTER — Other Ambulatory Visit: Payer: Self-pay

## 2018-05-18 ENCOUNTER — Inpatient Hospital Stay: Payer: Medicaid Other

## 2018-05-18 VITALS — BP 105/67 | HR 70 | Resp 18

## 2018-05-18 DIAGNOSIS — D500 Iron deficiency anemia secondary to blood loss (chronic): Secondary | ICD-10-CM | POA: Insufficient documentation

## 2018-05-18 DIAGNOSIS — N920 Excessive and frequent menstruation with regular cycle: Secondary | ICD-10-CM

## 2018-05-18 DIAGNOSIS — E538 Deficiency of other specified B group vitamins: Secondary | ICD-10-CM

## 2018-05-18 DIAGNOSIS — D508 Other iron deficiency anemias: Secondary | ICD-10-CM

## 2018-05-18 MED ORDER — SODIUM CHLORIDE 0.9 % IV SOLN
INTRAVENOUS | Status: DC
  Administered 2018-05-18: 14:00:00 via INTRAVENOUS
  Filled 2018-05-18: qty 250

## 2018-05-18 MED ORDER — SODIUM CHLORIDE 0.9% FLUSH
3.0000 mL | Freq: Once | INTRAVENOUS | Status: DC | PRN
  Filled 2018-05-18: qty 3

## 2018-05-18 MED ORDER — IRON SUCROSE 20 MG/ML IV SOLN
200.0000 mg | Freq: Once | INTRAVENOUS | Status: AC
  Administered 2018-05-18: 200 mg via INTRAVENOUS
  Filled 2018-05-18: qty 10

## 2018-05-24 LAB — VON WILLEBRAND FACTOR SCREEN
APTT: 24 s
Factor VIII Activity: 151 %
von Willebrand Factor Activity: 118 %
von Willebrand Factor Antigen: 91 %

## 2018-06-23 ENCOUNTER — Ambulatory Visit: Payer: Medicaid Other | Admitting: Family Medicine

## 2018-06-24 ENCOUNTER — Ambulatory Visit: Payer: Medicaid Other | Admitting: Family Medicine

## 2018-08-10 ENCOUNTER — Telehealth: Payer: Self-pay | Admitting: *Deleted

## 2018-08-10 ENCOUNTER — Inpatient Hospital Stay: Payer: Medicaid Other | Attending: Oncology

## 2018-08-10 NOTE — Telephone Encounter (Signed)
Patient 08/11/18 MD/Venofer appts was R/S  due to her being a No show for her 08/10/18 1:15 lab appt. All appt were R/S. I was unable to leave a message on her vmail due to it being full. Appt reminder letter mailed.

## 2018-08-11 ENCOUNTER — Inpatient Hospital Stay: Payer: Medicaid Other | Admitting: Oncology

## 2018-08-11 ENCOUNTER — Inpatient Hospital Stay: Payer: Medicaid Other

## 2018-08-17 ENCOUNTER — Other Ambulatory Visit: Payer: Self-pay

## 2018-08-18 ENCOUNTER — Other Ambulatory Visit: Payer: Self-pay

## 2018-08-18 ENCOUNTER — Inpatient Hospital Stay: Payer: Medicaid Other | Attending: Oncology

## 2018-08-18 DIAGNOSIS — N920 Excessive and frequent menstruation with regular cycle: Secondary | ICD-10-CM | POA: Diagnosis not present

## 2018-08-18 DIAGNOSIS — D500 Iron deficiency anemia secondary to blood loss (chronic): Secondary | ICD-10-CM

## 2018-08-18 DIAGNOSIS — R740 Nonspecific elevation of levels of transaminase and lactic acid dehydrogenase [LDH]: Secondary | ICD-10-CM | POA: Insufficient documentation

## 2018-08-18 DIAGNOSIS — Z791 Long term (current) use of non-steroidal anti-inflammatories (NSAID): Secondary | ICD-10-CM | POA: Insufficient documentation

## 2018-08-18 DIAGNOSIS — E538 Deficiency of other specified B group vitamins: Secondary | ICD-10-CM

## 2018-08-18 DIAGNOSIS — Z79899 Other long term (current) drug therapy: Secondary | ICD-10-CM | POA: Diagnosis not present

## 2018-08-18 LAB — CBC WITH DIFFERENTIAL/PLATELET
Abs Immature Granulocytes: 0 10*3/uL (ref 0.00–0.07)
Basophils Absolute: 0 10*3/uL (ref 0.0–0.1)
Basophils Relative: 0 %
Eosinophils Absolute: 0.2 10*3/uL (ref 0.0–0.5)
Eosinophils Relative: 3 %
HCT: 37 % (ref 36.0–46.0)
Hemoglobin: 12.3 g/dL (ref 12.0–15.0)
Immature Granulocytes: 0 %
Lymphocytes Relative: 34 %
Lymphs Abs: 1.7 10*3/uL (ref 0.7–4.0)
MCH: 28.5 pg (ref 26.0–34.0)
MCHC: 33.2 g/dL (ref 30.0–36.0)
MCV: 85.8 fL (ref 80.0–100.0)
Monocytes Absolute: 0.3 10*3/uL (ref 0.1–1.0)
Monocytes Relative: 6 %
Neutro Abs: 2.9 10*3/uL (ref 1.7–7.7)
Neutrophils Relative %: 57 %
Platelets: 228 10*3/uL (ref 150–400)
RBC: 4.31 MIL/uL (ref 3.87–5.11)
RDW: 12.4 % (ref 11.5–15.5)
WBC: 5.1 10*3/uL (ref 4.0–10.5)
nRBC: 0 % (ref 0.0–0.2)

## 2018-08-18 LAB — COMPREHENSIVE METABOLIC PANEL
ALT: 38 U/L (ref 0–44)
AST: 95 U/L — ABNORMAL HIGH (ref 15–41)
Albumin: 4.3 g/dL (ref 3.5–5.0)
Alkaline Phosphatase: 43 U/L (ref 38–126)
Anion gap: 8 (ref 5–15)
BUN: 12 mg/dL (ref 6–20)
CO2: 25 mmol/L (ref 22–32)
Calcium: 9 mg/dL (ref 8.9–10.3)
Chloride: 108 mmol/L (ref 98–111)
Creatinine, Ser: 0.83 mg/dL (ref 0.44–1.00)
GFR calc Af Amer: >60 mL/min (ref >60)
GFR calc non Af Amer: >60 mL/min (ref >60)
Glucose, Bld: 96 mg/dL (ref 70–99)
Potassium: 3.7 mmol/L (ref 3.5–5.1)
Sodium: 141 mmol/L (ref 135–145)
Total Bilirubin: 0.4 mg/dL (ref 0.3–1.2)
Total Protein: 7.8 g/dL (ref 6.5–8.1)

## 2018-08-18 LAB — IRON AND TIBC
Iron: 44 ug/dL (ref 28–170)
Saturation Ratios: 11 % (ref 10.4–31.8)
TIBC: 415 ug/dL (ref 250–450)
UIBC: 371 ug/dL

## 2018-08-18 LAB — FERRITIN: Ferritin: 8 ng/mL — ABNORMAL LOW (ref 11–307)

## 2018-08-18 LAB — VITAMIN B12: Vitamin B-12: 281 pg/mL (ref 180–914)

## 2018-08-19 ENCOUNTER — Other Ambulatory Visit: Payer: Self-pay

## 2018-08-20 ENCOUNTER — Other Ambulatory Visit: Payer: Self-pay

## 2018-08-20 ENCOUNTER — Inpatient Hospital Stay: Payer: Medicaid Other

## 2018-08-20 ENCOUNTER — Inpatient Hospital Stay (HOSPITAL_BASED_OUTPATIENT_CLINIC_OR_DEPARTMENT_OTHER): Payer: Medicaid Other | Admitting: Oncology

## 2018-08-20 VITALS — BP 100/65 | HR 54 | Temp 97.7°F | Resp 18

## 2018-08-20 VITALS — BP 107/67 | HR 73 | Temp 98.1°F | Resp 16 | Wt 163.5 lb

## 2018-08-20 DIAGNOSIS — D500 Iron deficiency anemia secondary to blood loss (chronic): Secondary | ICD-10-CM | POA: Diagnosis not present

## 2018-08-20 DIAGNOSIS — R740 Nonspecific elevation of levels of transaminase and lactic acid dehydrogenase [LDH]: Secondary | ICD-10-CM

## 2018-08-20 DIAGNOSIS — N920 Excessive and frequent menstruation with regular cycle: Secondary | ICD-10-CM

## 2018-08-20 DIAGNOSIS — R7401 Elevation of levels of liver transaminase levels: Secondary | ICD-10-CM

## 2018-08-20 DIAGNOSIS — R5383 Other fatigue: Secondary | ICD-10-CM | POA: Diagnosis not present

## 2018-08-20 DIAGNOSIS — E538 Deficiency of other specified B group vitamins: Secondary | ICD-10-CM | POA: Diagnosis not present

## 2018-08-20 DIAGNOSIS — D508 Other iron deficiency anemias: Secondary | ICD-10-CM

## 2018-08-20 MED ORDER — IRON SUCROSE 20 MG/ML IV SOLN
200.0000 mg | Freq: Once | INTRAVENOUS | Status: AC
  Administered 2018-08-20: 200 mg via INTRAVENOUS
  Filled 2018-08-20: qty 10

## 2018-08-20 MED ORDER — SODIUM CHLORIDE 0.9 % IV SOLN
Freq: Once | INTRAVENOUS | Status: AC
  Administered 2018-08-20: 14:00:00 via INTRAVENOUS
  Filled 2018-08-20: qty 250

## 2018-08-20 NOTE — Progress Notes (Signed)
Patient reports having heavy menstrual cycles and always feeling tired.  She has not taken any oral iron supplements.

## 2018-08-21 ENCOUNTER — Encounter: Payer: Self-pay | Admitting: Oncology

## 2018-08-21 MED ORDER — VITAMIN B-12 1000 MCG PO TABS: 1000.0000 ug | ORAL_TABLET | Freq: Every day | ORAL | 0 refills | Status: DC

## 2018-08-21 NOTE — Progress Notes (Signed)
Hematology/Oncology [facility] [phone] [phone]   Patient Care Team: Patient, No Pcp Per as PCP - General (General Practice)  REFERRING PROVIDER: OB/GYN  Philip Aspen CHIEF COMPLAINTS/REASON FOR VISIT:  Follow-up for iron deficiency anemia  HISTORY OF PRESENTING ILLNESS:  Kimberly Salinas is a  28 y.o.  female with PMH listed below who was referred to me for evaluation of anemia in pregnancy Reviewed patient's recent labs that was done done on 10/21/2017. Labs revealed anemia with hemoglobin of 7.5, MCV 78, normal platelet level and WBC.  No differential was obtained.  Ferritin was decreased at 4, on 09/24/2017.  Patient reports that he has been started on iron supplements with fusion plus orally.  She got about 2 weeks supply and did not continue due to the cost. Reviewed patient's previous labs , anemia is chronic onset , duration is since at least 2013. No aggravating or improving factors.  Associated signs and symptoms: Patient reports fatigue.  Denies SOB with exertion.  This is her third pregnancy. Denies weight loss, easy bruising, hematochezia, hemoptysis, hematuria. Context: History of GI bleeding: Denies                                           Last colonoscopy: Not available               History of heavy menstrual period.  Yes  INTERVAL HISTORY Kimberly Salinas is a 28 y.o. female who has above history reviewed by me today presents for follow up visit for management of iron deficiency anemia, Problems and complaints are listed below: Patient continues to feel more fatigued and tired recently.  Heavy menstrual..  Use 5 pads per hour on the heaviest today. She is looking for new job.  Her baby is 44-month-old.  Review of Systems  Constitutional: Positive for malaise/fatigue. Negative for chills, fever and weight loss.  HENT: Negative for nosebleeds and sore throat.   Eyes: Negative for double vision, photophobia  and redness.  Respiratory: Negative for cough, shortness of breath and wheezing.   Cardiovascular: Negative for chest pain, palpitations, orthopnea and leg swelling.  Gastrointestinal: Negative for abdominal pain, blood in stool, nausea and vomiting.  Genitourinary: Negative for dysuria.  Musculoskeletal: Negative for back pain, myalgias and neck pain.  Skin: Negative for itching and rash.  Neurological: Negative for dizziness, tingling and tremors.  Endo/Heme/Allergies: Negative for environmental allergies. Does not bruise/bleed easily.  Psychiatric/Behavioral: Negative for depression and hallucinations.    MEDICAL HISTORY:  Past Medical History:  Diagnosis Date  . Anemia   . No pertinent past medical history     SURGICAL HISTORY: Past Surgical History:  Procedure Laterality Date  . NO PAST SURGERIES      SOCIAL HISTORY: Social History   Socioeconomic History  . Marital status: Single    Spouse name: Not on file  . Number of children: 2  . Years of education: Not on file  . Highest education level: Not on file  Occupational History  . Not on file  Social Needs  . Financial resource strain: Not on file  . Food insecurity    Worry: Not on file    Inability: Not on file  . Transportation needs    Medical: Not on file    Non-medical: Not on file  Tobacco Use  . Smoking status: Never Smoker  .  Smokeless tobacco: Never Used  Substance and Sexual Activity  . Alcohol use: No  . Drug use: No  . Sexual activity: Yes    Birth control/protection: None  Lifestyle  . Physical activity    Days per week: Not on file    Minutes per session: Not on file  . Stress: Not on file  Relationships  . Social Musicianconnections    Talks on phone: Not on file    Gets together: Not on file    Attends religious service: Not on file    Active member of club or organization: Not on file    Attends meetings of clubs or organizations: Not on file    Relationship status: Not on file  .  Intimate partner violence    Fear of current or ex partner: Not on file    Emotionally abused: Not on file    Physically abused: Not on file    Forced sexual activity: Not on file  Other Topics Concern  . Not on file  Social History Narrative  . Not on file    FAMILY HISTORY: Family History  Problem Relation Age of Onset  . Diabetes Paternal Aunt   . Hypertension Paternal Aunt   . Diabetes Paternal Uncle   . Hypertension Paternal Uncle   . Anemia Mother   . Cancer Paternal Grandfather        Unknown type   . Anesthesia problems Neg Hx   . Hypotension Neg Hx   . Malignant hyperthermia Neg Hx   . Pseudochol deficiency Neg Hx     ALLERGIES:  has No Known Allergies.  MEDICATIONS:  Current Outpatient Medications  Medication Sig Dispense Refill  . ibuprofen (ADVIL,MOTRIN) 600 MG tablet Take 1 tablet (600 mg total) by mouth every 6 (six) hours. 30 tablet 0  . docusate sodium (COLACE) 100 MG capsule Take 1 capsule (100 mg total) by mouth 2 (two) times daily. (Patient not taking: Reported on 02/10/2018) 10 capsule 0  . ferrous sulfate 325 (65 FE) MG EC tablet Take 1 tablet (325 mg total) by mouth 2 (two) times daily. (Patient not taking: Reported on 08/20/2018) 60 tablet 3  . Prenatal Vit-Fe Fumarate-FA (PREPLUS) 27-1 MG TABS Take 1 tablet by mouth daily. (Patient not taking: Reported on 05/11/2018) 30 tablet 13   No current facility-administered medications for this visit.      PHYSICAL EXAMINATION: ECOG PERFORMANCE STATUS: 1 - Symptomatic but completely ambulatory Vitals:   08/20/18 1334  BP: 107/67  Pulse: 73  Resp: 16  Temp: 98.1 F (36.7 C)   Filed Weights   08/20/18 1334  Weight: 163 lb 8 oz (74.2 kg)    Physical Exam Constitutional:      General: She is not in acute distress. HENT:     Head: Normocephalic and atraumatic.  Eyes:     General: No scleral icterus.    Pupils: Pupils are equal, round, and reactive to light.  Neck:     Musculoskeletal: Normal range  of motion and neck supple.  Cardiovascular:     Rate and Rhythm: Normal rate and regular rhythm.     Heart sounds: Normal heart sounds.  Pulmonary:     Effort: Pulmonary effort is normal. No respiratory distress.     Breath sounds: No wheezing.  Abdominal:     General: Bowel sounds are normal. There is no distension.     Palpations: Abdomen is soft. There is no mass.     Tenderness: There is no  abdominal tenderness.     Comments: Gravidas uterus  Musculoskeletal: Normal range of motion.        General: No deformity.  Skin:    General: Skin is warm and dry.     Findings: No erythema or rash.  Neurological:     Mental Status: She is alert and oriented to person, place, and time.     Cranial Nerves: No cranial nerve deficit.     Coordination: Coordination normal.  Psychiatric:        Behavior: Behavior normal.        Thought Content: Thought content normal.      LABORATORY DATA:  I have reviewed the data as listed Lab Results  Component Value Date   WBC 5.1 08/18/2018   HGB 12.3 08/18/2018   HCT 37.0 08/18/2018   MCV 85.8 08/18/2018   PLT 228 08/18/2018   Recent Labs    11/03/17 1114 08/18/18 1347  NA 136 141  K 4.0 3.7  CL 105 108  CO2 23 25  GLUCOSE 89 96  BUN 7 12  CREATININE 0.58 0.83  CALCIUM 8.6* 9.0  GFRNONAA >60 >60  GFRAA >60 >60  PROT 6.7 7.8  ALBUMIN 3.0* 4.3  AST 18 95*  ALT 8 38  ALKPHOS 128* 43  BILITOT 0.4 0.4   Iron/TIBC/Ferritin/ %Sat    Component Value Date/Time   IRON 44 08/18/2018 1347   TIBC 415 08/18/2018 1347   FERRITIN 8 (L) 08/18/2018 1347   FERRITIN 4 (L) 09/24/2017 1420   IRONPCTSAT 11 08/18/2018 1347        ASSESSMENT & PLAN:  1. Menorrhagia with regular cycle   2. Iron deficiency anemia due to chronic blood loss   3. Low vitamin B12 level   4. Other fatigue   5. Transaminitis    Labs are reviewed and discussed with patient. Hemoglobin remained stable. However ferritin further decreased to 8, saturation ratio  11, TIBC 415. Patient is fatigued.  Recommend proceed with IV Venofer weekly x2. Chronic iron deficiency secondary to ongoing blood loss. Recommend patient to follow-up with GYN for evaluation of menorrhagia.  Vitamin B12 levels at 281.  Recommend patient to start oral vitamin B12 supplementation. Transaminitis, AST 95.  Continue monitor.  Repeat LFT at next visit. Orders Placed This Encounter  Procedures  . CBC with Differential/Platelet    Standing Status:   Future    Standing Expiration Date:   08/21/2019  . Iron and TIBC    Standing Status:   Future    Standing Expiration Date:   08/21/2019  . Ferritin    Standing Status:   Future    Standing Expiration Date:   08/21/2019  . Vitamin B12    Standing Status:   Future    Standing Expiration Date:   08/21/2019    All questions were answered. The patient knows to call the clinic with any problems questions or concerns.  Return of visit: 16 weeks  Rickard PatienceZhou Emiya Loomer, MD, PhD Hematology Oncology [facility] Pager- [phone] 08/21/2018

## 2018-09-02 ENCOUNTER — Other Ambulatory Visit: Payer: Self-pay

## 2018-09-03 ENCOUNTER — Other Ambulatory Visit: Payer: Self-pay

## 2018-09-03 ENCOUNTER — Inpatient Hospital Stay: Payer: Medicaid Other

## 2018-09-03 VITALS — BP 115/68 | HR 64 | Resp 18

## 2018-09-03 DIAGNOSIS — D500 Iron deficiency anemia secondary to blood loss (chronic): Secondary | ICD-10-CM | POA: Diagnosis not present

## 2018-09-03 DIAGNOSIS — D508 Other iron deficiency anemias: Secondary | ICD-10-CM

## 2018-09-03 MED ORDER — IRON SUCROSE 20 MG/ML IV SOLN
200.0000 mg | Freq: Once | INTRAVENOUS | Status: AC
  Administered 2018-09-03: 200 mg via INTRAVENOUS
  Filled 2018-09-03: qty 10

## 2018-09-03 MED ORDER — SODIUM CHLORIDE 0.9 % IV SOLN
INTRAVENOUS | Status: DC
  Administered 2018-09-03: 15:00:00 via INTRAVENOUS
  Filled 2018-09-03: qty 250

## 2018-09-17 ENCOUNTER — Other Ambulatory Visit: Payer: Medicaid Other

## 2018-09-21 ENCOUNTER — Encounter: Payer: Self-pay | Admitting: Certified Nurse Midwife

## 2018-09-21 ENCOUNTER — Other Ambulatory Visit (HOSPITAL_COMMUNITY)
Admission: RE | Admit: 2018-09-21 | Discharge: 2018-09-21 | Disposition: A | Discharge status: Home / Self Care | Payer: Medicaid Other | Source: Ambulatory Visit | Attending: Certified Nurse Midwife | Admitting: Certified Nurse Midwife

## 2018-09-21 ENCOUNTER — Other Ambulatory Visit: Payer: Self-pay

## 2018-09-21 ENCOUNTER — Ambulatory Visit (INDEPENDENT_AMBULATORY_CARE_PROVIDER_SITE_OTHER): Payer: Medicaid Other | Admitting: Certified Nurse Midwife

## 2018-09-21 VITALS — BP 96/60 | HR 77 | Ht 64.0 in | Wt 167.0 lb

## 2018-09-21 DIAGNOSIS — N920 Excessive and frequent menstruation with regular cycle: Secondary | ICD-10-CM

## 2018-09-21 DIAGNOSIS — N898 Other specified noninflammatory disorders of vagina: Secondary | ICD-10-CM | POA: Diagnosis present

## 2018-09-21 DIAGNOSIS — D500 Iron deficiency anemia secondary to blood loss (chronic): Secondary | ICD-10-CM | POA: Diagnosis not present

## 2018-09-21 NOTE — Progress Notes (Signed)
GYN ENCOUNTER NOTE  Subjective:       Kimberly Salinas is a 28 y.o. [phone] female is here for gynecologic evaluation of the following issues:  1. Menorrhagia with regular cycle for the last nine (9) months 2. Blood loss anemia due to menses 3. Vaginal odor  Denies difficulty breathing or respiratory distress, chest pain, dysuria, and leg pain or swelling.    Gynecologic History  Patient's last menstrual period was 09/18/2018 (exact date). Period Cycle (Days): 28 Period Duration (Days): 5 Period Pattern: Regular Menstrual Flow: Heavy Menstrual Control: Thin pad Dysmenorrhea: (!) Moderate Dysmenorrhea Symptoms: Cramping, Other (Comment)("My whole body aches")  Contraception: abstinence  Last Pap: 05/2017. Results were: normal  Obstetric History  OB History  Gravida Para Term Preterm AB Living  3 3 3     3   SAB TAB Ectopic Multiple Live Births        0 3    # Outcome Date GA Lbr Len/2nd Weight Sex Delivery Anes PTL Lv  3 Term 11/30/17 [redacted]w[redacted]d / 00:23 8 lb 9.2 oz (3.89 kg) F Vag-Spont EPI N LIV  2 Term 10/06/12   7 lb 4 oz (3.289 kg) M Vag-Spont EPI N LIV  1 Term 04/12/11 [redacted]w[redacted]d 28:45 / 03:00 8 lb 2.5 oz (3.7 kg) F Vag-Spont EPI N LIV    Past Medical History:  Diagnosis Date  . Anemia     Past Surgical History:  Procedure Laterality Date  . NO PAST SURGERIES      Current Outpatient Medications on File Prior to Visit  Medication Sig Dispense Refill  . vitamin B-12 (CYANOCOBALAMIN) 1000 MCG tablet Take 1 tablet (1,000 mcg total) by mouth daily. 90 tablet 0   No current facility-administered medications on file prior to visit.     No Known Allergies  Social History   Socioeconomic History  . Marital status: Single    Spouse name: Not on file  . Number of children: 2  . Years of education: Not on file  . Highest education level: Not on file  Occupational History  . Not on file  Social Needs  . Financial resource strain: Not on file  . Food insecurity     Worry: Not on file    Inability: Not on file  . Transportation needs    Medical: Not on file    Non-medical: Not on file  Tobacco Use  . Smoking status: Never Smoker  . Smokeless tobacco: Never Used  Substance and Sexual Activity  . Alcohol use: No  . Drug use: No  . Sexual activity: Not Currently    Birth control/protection: None  Lifestyle  . Physical activity    Days per week: Not on file    Minutes per session: Not on file  . Stress: Not on file  Relationships  . Social Herbalist on phone: Not on file    Gets together: Not on file    Attends religious service: Not on file    Active member of club or organization: Not on file    Attends meetings of clubs or organizations: Not on file    Relationship status: Not on file  . Intimate partner violence    Fear of current or ex partner: Not on file    Emotionally abused: Not on file    Physically abused: Not on file    Forced sexual activity: Not on file  Other Topics Concern  . Not on file  Social History Narrative  .  Not on file    Family History  Problem Relation Age of Onset  . Diabetes Paternal Aunt   . Hypertension Paternal Aunt   . Diabetes Paternal Uncle   . Hypertension Paternal Uncle   . Anemia Mother   . Cancer Paternal Grandfather        Unknown type   . Breast cancer Maternal Aunt   . Anesthesia problems Neg Hx   . Hypotension Neg Hx   . Malignant hyperthermia Neg Hx   . Pseudochol deficiency Neg Hx   . Ovarian cancer Neg Hx   . Colon cancer Neg Hx     The following portions of the patient's history were reviewed and updated as appropriate: allergies, current medications, past family history, past medical history, past social history, past surgical history and problem list.  Review of Systems  ROS negative except as noted above. Information obtained from patient.   Objective:   BP 96/60   Pulse 77   Ht 5\' 4"  (1.626 m)   Wt 167 lb (75.8 kg)   LMP 09/18/2018 (Exact Date)    Breastfeeding No   BMI 28.67 kg/m    CONSTITUTIONAL: Well-developed, well-nourished female in no acute distress.    PELVIC:  External Genitalia: Normal  Vagina: Normal, swab collected   MUSCULOSKELETAL: Normal range of motion. No tenderness.  No cyanosis, clubbing, or edema.  Assessment:   1. Menorrhagia with regular cycle  - Cervicovaginal ancillary only - Estradiol - FSH/LH - TSH  2. Vaginal odor  - Cervicovaginal ancillary only  3. Blood loss anemia  - Estradiol - FSH/LH - TSH   Plan:   Vaginal swab collected, see orders.   Labs today, see orders.   Patient declines hormonal regulation at this time. Education regarding herbal management of symptoms.   Reviewed red flag symptoms and when to call.   RTC as needed.    Kimberly Salinas, CNM [facility], [facility] 09/21/18 6:45 PM

## 2018-09-21 NOTE — Progress Notes (Signed)
Patient c/o heavy menstrual periods with clots (nickle size) and painful x9 months. Day 2 of menses is heaviest, changing >PPH.

## 2018-09-21 NOTE — Patient Instructions (Signed)

## 2018-09-22 LAB — TSH: TSH: 2.46 u[IU]/mL (ref 0.450–4.500)

## 2018-09-22 LAB — FSH/LH
FSH: 5.5 m[IU]/mL
LH: 7.6 m[IU]/mL

## 2018-09-22 LAB — ESTRADIOL: Estradiol: 31.8 pg/mL

## 2018-09-23 LAB — CERVICOVAGINAL ANCILLARY ONLY
Bacterial vaginitis: NEGATIVE
Candida vaginitis: NEGATIVE
Trichomonas: NEGATIVE

## 2018-12-16 ENCOUNTER — Other Ambulatory Visit: Payer: Self-pay

## 2018-12-17 ENCOUNTER — Inpatient Hospital Stay: Payer: Medicaid Other | Attending: Oncology

## 2018-12-17 ENCOUNTER — Other Ambulatory Visit: Payer: Self-pay

## 2018-12-17 DIAGNOSIS — Z803 Family history of malignant neoplasm of breast: Secondary | ICD-10-CM | POA: Diagnosis not present

## 2018-12-17 DIAGNOSIS — E538 Deficiency of other specified B group vitamins: Secondary | ICD-10-CM

## 2018-12-17 DIAGNOSIS — N920 Excessive and frequent menstruation with regular cycle: Secondary | ICD-10-CM | POA: Diagnosis not present

## 2018-12-17 DIAGNOSIS — R5383 Other fatigue: Secondary | ICD-10-CM

## 2018-12-17 DIAGNOSIS — D500 Iron deficiency anemia secondary to blood loss (chronic): Secondary | ICD-10-CM

## 2018-12-17 DIAGNOSIS — Z8249 Family history of ischemic heart disease and other diseases of the circulatory system: Secondary | ICD-10-CM | POA: Diagnosis not present

## 2018-12-17 DIAGNOSIS — R7401 Elevation of levels of liver transaminase levels: Secondary | ICD-10-CM

## 2018-12-17 LAB — HEPATIC FUNCTION PANEL
ALT: 16 U/L (ref 0–44)
AST: 16 U/L (ref 15–41)
Albumin: 4.4 g/dL (ref 3.5–5.0)
Alkaline Phosphatase: 48 U/L (ref 38–126)
Bilirubin, Direct: <0.1 mg/dL (ref 0.0–0.2)
Total Bilirubin: 0.4 mg/dL (ref 0.3–1.2)
Total Protein: 7.9 g/dL (ref 6.5–8.1)

## 2018-12-17 LAB — CBC WITH DIFFERENTIAL/PLATELET
Abs Immature Granulocytes: 0.01 10*3/uL (ref 0.00–0.07)
Basophils Absolute: 0 10*3/uL (ref 0.0–0.1)
Basophils Relative: 0 %
Eosinophils Absolute: 0.3 10*3/uL (ref 0.0–0.5)
Eosinophils Relative: 5 %
HCT: 38.7 % (ref 36.0–46.0)
Hemoglobin: 12.8 g/dL (ref 12.0–15.0)
Immature Granulocytes: 0 %
Lymphocytes Relative: 31 %
Lymphs Abs: 1.8 10*3/uL (ref 0.7–4.0)
MCH: 29.5 pg (ref 26.0–34.0)
MCHC: 33.1 g/dL (ref 30.0–36.0)
MCV: 89.2 fL (ref 80.0–100.0)
Monocytes Absolute: 0.4 10*3/uL (ref 0.1–1.0)
Monocytes Relative: 7 %
Neutro Abs: 3.3 10*3/uL (ref 1.7–7.7)
Neutrophils Relative %: 57 %
Platelets: 256 10*3/uL (ref 150–400)
RBC: 4.34 MIL/uL (ref 3.87–5.11)
RDW: 12.3 % (ref 11.5–15.5)
WBC: 5.8 10*3/uL (ref 4.0–10.5)
nRBC: 0 % (ref 0.0–0.2)

## 2018-12-17 LAB — IRON AND TIBC
Iron: 61 ug/dL (ref 28–170)
Saturation Ratios: 14 % (ref 10.4–31.8)
TIBC: 443 ug/dL (ref 250–450)
UIBC: 382 ug/dL

## 2018-12-17 LAB — FERRITIN: Ferritin: 15 ng/mL (ref 11–307)

## 2018-12-17 LAB — VITAMIN B12: Vitamin B-12: 445 pg/mL (ref 180–914)

## 2018-12-20 ENCOUNTER — Inpatient Hospital Stay: Payer: Medicaid Other

## 2018-12-20 ENCOUNTER — Encounter: Payer: Self-pay | Admitting: Oncology

## 2018-12-20 ENCOUNTER — Inpatient Hospital Stay (HOSPITAL_BASED_OUTPATIENT_CLINIC_OR_DEPARTMENT_OTHER): Payer: Medicaid Other | Admitting: Oncology

## 2018-12-20 DIAGNOSIS — E538 Deficiency of other specified B group vitamins: Secondary | ICD-10-CM

## 2018-12-20 DIAGNOSIS — D508 Other iron deficiency anemias: Secondary | ICD-10-CM | POA: Diagnosis not present

## 2018-12-20 DIAGNOSIS — N920 Excessive and frequent menstruation with regular cycle: Secondary | ICD-10-CM

## 2018-12-20 MED ORDER — FERROUS SULFATE 325 (65 FE) MG PO TBEC: 325.0000 mg | DELAYED_RELEASE_TABLET | Freq: Every day | ORAL | 1 refills | Status: DC

## 2018-12-20 NOTE — Progress Notes (Signed)
Patient verified using two identifiers for virtual visit via telephone today.  Patient does not offer any problems today.  

## 2018-12-20 NOTE — Progress Notes (Signed)
HEMATOLOGY-ONCOLOGY TeleHEALTH VISIT PROGRESS NOTE  I connected with Kimberly Salinas on 12/20/18 at  9:30 AM EST by video enabled telemedicine visit and verified that I am speaking with the correct person using two identifiers. I discussed the limitations, risks, security and privacy concerns of performing an evaluation and management service by telemedicine and the availability of in-person appointments. I also discussed with the patient that there may be a patient responsible charge related to this service. The patient expressed understanding and agreed to proceed.   Other persons participating in the visit and their role in the encounter:  None  Patient's location: Home  Provider's location: office Chief Complaint: Iron deficiency anemia   INTERVAL HISTORY Pattiann Quincy SheehanHarley Haisley is a 28 y.o. female who has above history reviewed by me today presents for follow up visit for management of iron deficiency anemia Problems and complaints are listed below:  Patient reports feeling better.  Fatigue has improved.  She is status post IV Venofer 200 mg x 2 recently. No new complaints.  Review of Systems  Constitutional: Negative for appetite change, chills, fatigue and fever.  HENT:   Negative for hearing loss and voice change.   Eyes: Negative for eye problems.  Respiratory: Negative for chest tightness and cough.   Cardiovascular: Negative for chest pain.  Gastrointestinal: Negative for abdominal distention, abdominal pain and blood in stool.  Endocrine: Negative for hot flashes.  Genitourinary: Negative for difficulty urinating and frequency.   Musculoskeletal: Negative for arthralgias.  Skin: Negative for itching and rash.  Neurological: Negative for extremity weakness.  Hematological: Negative for adenopathy.  Psychiatric/Behavioral: Negative for confusion.    Past Medical History:  Diagnosis Date  . Anemia    Past Surgical History:  Procedure Laterality Date  . NO PAST  SURGERIES      Family History  Problem Relation Age of Onset  . Diabetes Paternal Aunt   . Hypertension Paternal Aunt   . Diabetes Paternal Uncle   . Hypertension Paternal Uncle   . Anemia Mother   . Cancer Paternal Grandfather        Unknown type   . Breast cancer Maternal Aunt   . Anesthesia problems Neg Hx   . Hypotension Neg Hx   . Malignant hyperthermia Neg Hx   . Pseudochol deficiency Neg Hx   . Ovarian cancer Neg Hx   . Colon cancer Neg Hx     Social History   Socioeconomic History  . Marital status: Single    Spouse name: Not on file  . Number of children: 2  . Years of education: Not on file  . Highest education level: Not on file  Occupational History  . Not on file  Social Needs  . Financial resource strain: Not on file  . Food insecurity    Worry: Not on file    Inability: Not on file  . Transportation needs    Medical: Not on file    Non-medical: Not on file  Tobacco Use  . Smoking status: Never Smoker  . Smokeless tobacco: Never Used  Substance and Sexual Activity  . Alcohol use: No  . Drug use: No  . Sexual activity: Not Currently    Birth control/protection: None  Lifestyle  . Physical activity    Days per week: Not on file    Minutes per session: Not on file  . Stress: Not on file  Relationships  . Social connections    Talks on phone: Not on file  Gets together: Not on file    Attends religious service: Not on file    Active member of club or organization: Not on file    Attends meetings of clubs or organizations: Not on file    Relationship status: Not on file  . Intimate partner violence    Fear of current or ex partner: Not on file    Emotionally abused: Not on file    Physically abused: Not on file    Forced sexual activity: Not on file  Other Topics Concern  . Not on file  Social History Narrative  . Not on file    Current Outpatient Medications on File Prior to Visit  Medication Sig Dispense Refill  . vitamin B-12  (CYANOCOBALAMIN) 1000 MCG tablet Take 1 tablet (1,000 mcg total) by mouth daily. (Patient not taking: Reported on 12/20/2018) 90 tablet 0   No current facility-administered medications on file prior to visit.     No Known Allergies     Observations/Objective: Today's Vitals   12/20/18 0943  PainSc: 0-No pain   There is no height or weight on file to calculate BMI.  Physical Exam  Constitutional: She is oriented to person, place, and time. No distress.  Neurological: She is alert and oriented to person, place, and time.  Psychiatric: Affect normal.    CBC    Component Value Date/Time   WBC 5.8 12/17/2018 1320   RBC 4.34 12/17/2018 1320   HGB 12.8 12/17/2018 1320   HGB 7.5 (L) 10/21/2017 0940   HCT 38.7 12/17/2018 1320   HCT 23.2 (L) 10/21/2017 0940   PLT 256 12/17/2018 1320   PLT 233 10/21/2017 0940   MCV 89.2 12/17/2018 1320   MCV 78 (L) 10/21/2017 0940   MCV 74 (L) 10/06/2013 0833   MCH 29.5 12/17/2018 1320   MCHC 33.1 12/17/2018 1320   RDW 12.3 12/17/2018 1320   RDW 15.1 10/21/2017 0940   RDW 17.9 (H) 10/06/2013 0833   LYMPHSABS 1.8 12/17/2018 1320   LYMPHSABS 1.3 06/05/2017 0955   LYMPHSABS 1.8 10/06/2013 0833   MONOABS 0.4 12/17/2018 1320   MONOABS 0.6 10/06/2013 0833   EOSABS 0.3 12/17/2018 1320   EOSABS 0.1 06/05/2017 0955   EOSABS 0.0 10/06/2013 0833   BASOSABS 0.0 12/17/2018 1320   BASOSABS 0.0 06/05/2017 0955   BASOSABS 0.0 10/06/2013 0833    CMP     Component Value Date/Time   NA 141 08/18/2018 1347   K 3.7 08/18/2018 1347   CL 108 08/18/2018 1347   CO2 25 08/18/2018 1347   GLUCOSE 96 08/18/2018 1347   BUN 12 08/18/2018 1347   CREATININE 0.83 08/18/2018 1347   CALCIUM 9.0 08/18/2018 1347   PROT 7.9 12/17/2018 1320   ALBUMIN 4.4 12/17/2018 1320   AST 16 12/17/2018 1320   ALT 16 12/17/2018 1320   ALKPHOS 48 12/17/2018 1320   BILITOT 0.4 12/17/2018 1320   GFRNONAA >60 08/18/2018 1347   GFRAA >60 08/18/2018 1347     Assessment and Plan: 1.  Other iron deficiency anemia   2. Menorrhagia with regular cycle   3. Low vitamin B12 level     Iron deficiency anemia, status post IV Venofer weekly x2. Labs are reviewed and discussed with patient. Hemoglobin is always stable and normal.  Iron panel improved No need for additional IV iron at this point. Recommend patient to start ferrous sulfate 325 mg daily with meals.  Rationale and potential side effects discussed with patient.  Patient may use  stool softener Colace if constipation.  Recommend patient to follow-up with gynecology for management of menorrhagia.  Vitamin B12 level was previously at lower normal and.  Patient has been on oral vitamin B12 supplementation and B12 level has improved.  She may discontinue vitamin B12 for the time being. Transaminitis has resolved. Follow Up Instructions: 6 months.   I discussed the assessment and treatment plan with the patient. The patient was provided an opportunity to ask questions and all were answered. The patient agreed with the plan and demonstrated an understanding of the instructions.  The patient was advised to call back or seek an in-person evaluation if the symptoms worsen or if the condition fails to improve as anticipated.   Earlie Server, MD 12/20/2018 3:46 PM

## 2018-12-21 ENCOUNTER — Telehealth: Payer: Self-pay

## 2018-12-21 NOTE — Telephone Encounter (Signed)
Informed patient that Ferrous Sulfate rx is not covered by her insurance.  She can get this medication OTC and can discuss with her pharmacist if she needs help finding medication.

## 2019-04-06 DIAGNOSIS — R251 Tremor, unspecified: Secondary | ICD-10-CM | POA: Insufficient documentation

## 2019-04-06 DIAGNOSIS — F419 Anxiety disorder, unspecified: Secondary | ICD-10-CM | POA: Insufficient documentation

## 2019-06-17 ENCOUNTER — Telehealth: Payer: Self-pay

## 2019-06-17 ENCOUNTER — Inpatient Hospital Stay: Payer: Medicaid Other | Attending: Oncology

## 2019-06-17 DIAGNOSIS — D500 Iron deficiency anemia secondary to blood loss (chronic): Secondary | ICD-10-CM | POA: Insufficient documentation

## 2019-06-17 DIAGNOSIS — E538 Deficiency of other specified B group vitamins: Secondary | ICD-10-CM | POA: Insufficient documentation

## 2019-06-17 DIAGNOSIS — N920 Excessive and frequent menstruation with regular cycle: Secondary | ICD-10-CM | POA: Insufficient documentation

## 2019-06-17 NOTE — Telephone Encounter (Signed)
Done.Marland Kitchen...  06/17/19 NS Lab and 06/20/19 MD(In Person) appts has been R/S as requested.  Called pt x2 and was unable to reach her by phone.  A detailed message was left on her vmail making her aware of her R/S appt.  New appt reminder letter was mailed out.

## 2019-06-17 NOTE — Telephone Encounter (Signed)
Pt no showed to labs today. Please call pt and  reschedule appts.   Lab/MD with labs prior. Thanks

## 2019-06-20 ENCOUNTER — Inpatient Hospital Stay: Payer: Medicaid Other | Admitting: Oncology

## 2019-06-22 ENCOUNTER — Ambulatory Visit: Payer: Self-pay | Admitting: Podiatry

## 2019-06-29 ENCOUNTER — Other Ambulatory Visit: Payer: Self-pay

## 2019-06-29 ENCOUNTER — Inpatient Hospital Stay: Payer: Medicaid Other

## 2019-06-29 DIAGNOSIS — E538 Deficiency of other specified B group vitamins: Secondary | ICD-10-CM | POA: Diagnosis not present

## 2019-06-29 DIAGNOSIS — D500 Iron deficiency anemia secondary to blood loss (chronic): Secondary | ICD-10-CM | POA: Diagnosis not present

## 2019-06-29 DIAGNOSIS — N920 Excessive and frequent menstruation with regular cycle: Secondary | ICD-10-CM

## 2019-06-29 DIAGNOSIS — D508 Other iron deficiency anemias: Secondary | ICD-10-CM

## 2019-06-29 LAB — IRON AND TIBC
Iron: 49 ug/dL (ref 28–170)
Saturation Ratios: 11 % (ref 10.4–31.8)
TIBC: 449 ug/dL (ref 250–450)
UIBC: 400 ug/dL

## 2019-06-29 LAB — CBC WITH DIFFERENTIAL/PLATELET
Abs Immature Granulocytes: 0.02 10*3/uL (ref 0.00–0.07)
Basophils Absolute: 0 10*3/uL (ref 0.0–0.1)
Basophils Relative: 0 %
Eosinophils Absolute: 0.1 10*3/uL (ref 0.0–0.5)
Eosinophils Relative: 2 %
HCT: 36.8 % (ref 36.0–46.0)
Hemoglobin: 12.3 g/dL (ref 12.0–15.0)
Immature Granulocytes: 0 %
Lymphocytes Relative: 35 %
Lymphs Abs: 1.9 10*3/uL (ref 0.7–4.0)
MCH: 28 pg (ref 26.0–34.0)
MCHC: 33.4 g/dL (ref 30.0–36.0)
MCV: 83.6 fL (ref 80.0–100.0)
Monocytes Absolute: 0.4 10*3/uL (ref 0.1–1.0)
Monocytes Relative: 7 %
Neutro Abs: 3.1 10*3/uL (ref 1.7–7.7)
Neutrophils Relative %: 56 %
Platelets: 279 10*3/uL (ref 150–400)
RBC: 4.4 MIL/uL (ref 3.87–5.11)
RDW: 13.1 % (ref 11.5–15.5)
WBC: 5.5 10*3/uL (ref 4.0–10.5)
nRBC: 0 % (ref 0.0–0.2)

## 2019-06-29 LAB — FERRITIN: Ferritin: 8 ng/mL — ABNORMAL LOW (ref 11–307)

## 2019-07-01 ENCOUNTER — Other Ambulatory Visit: Payer: Self-pay

## 2019-07-01 ENCOUNTER — Encounter: Payer: Self-pay | Admitting: Oncology

## 2019-07-01 ENCOUNTER — Inpatient Hospital Stay (HOSPITAL_BASED_OUTPATIENT_CLINIC_OR_DEPARTMENT_OTHER): Payer: Medicaid Other | Admitting: Oncology

## 2019-07-01 VITALS — BP 94/61 | HR 63 | Temp 96.7°F | Resp 16 | Wt 190.8 lb

## 2019-07-01 DIAGNOSIS — N920 Excessive and frequent menstruation with regular cycle: Secondary | ICD-10-CM | POA: Diagnosis not present

## 2019-07-01 DIAGNOSIS — D500 Iron deficiency anemia secondary to blood loss (chronic): Secondary | ICD-10-CM | POA: Diagnosis not present

## 2019-07-01 DIAGNOSIS — E538 Deficiency of other specified B group vitamins: Secondary | ICD-10-CM

## 2019-07-01 DIAGNOSIS — D508 Other iron deficiency anemias: Secondary | ICD-10-CM | POA: Diagnosis not present

## 2019-07-01 NOTE — Progress Notes (Signed)
Hematology/Oncology [facility] Telephone:([phone] [phone]   Patient Care Team: Patient, No Pcp Per as PCP - General (General Practice)  REFERRING PROVIDER: OB/GYN  Doreene Burke CHIEF COMPLAINTS/REASON FOR VISIT:  Follow-up for iron deficiency anemia  HISTORY OF PRESENTING ILLNESS:  Kimberly Salinas is a  29 y.o.  female with PMH listed below who was referred to me for evaluation of anemia in pregnancy Reviewed patient's recent labs that was done done on 10/21/2017. Labs revealed anemia with hemoglobin of 7.5, MCV 78, normal platelet level and WBC.  No differential was obtained.  Ferritin was decreased at 4, on 09/24/2017.  Patient reports that he has been started on iron supplements with fusion plus orally.  She got about 2 weeks supply and did not continue due to the cost. Reviewed patient's previous labs , anemia is chronic onset , duration is since at least 2013. No aggravating or improving factors.  Associated signs and symptoms: Patient reports fatigue.  Denies SOB with exertion.  This is her third pregnancy. Denies weight loss, easy bruising, hematochezia, hemoptysis, hematuria. Context: History of GI bleeding: Denies                                           Last colonoscopy: Not available               History of heavy menstrual period.  Yes  INTERVAL HISTORY Kimberly Salinas is a 29 y.o. female who has above history reviewed by me today presents for follow up visit for management of iron deficiency anemia, Problems and complaints are listed below: Patient reports feeling well. Continues to have heavy menstrual..  She has not seen GYN yet. Otherwise no new complaints.  Review of Systems  Constitutional: Negative for chills, fever, malaise/fatigue and weight loss.  HENT: Negative for nosebleeds and sore throat.   Eyes: Negative for double vision, photophobia and redness.  Respiratory: Negative for cough, shortness of breath  and wheezing.   Cardiovascular: Negative for chest pain, palpitations, orthopnea and leg swelling.  Gastrointestinal: Negative for abdominal pain, blood in stool, nausea and vomiting.  Genitourinary: Negative for dysuria.  Musculoskeletal: Negative for back pain, myalgias and neck pain.  Skin: Negative for itching and rash.  Neurological: Negative for dizziness, tingling and tremors.  Endo/Heme/Allergies: Negative for environmental allergies. Does not bruise/bleed easily.  Psychiatric/Behavioral: Negative for depression and hallucinations.    MEDICAL HISTORY:  Past Medical History:  Diagnosis Date  . Anemia     SURGICAL HISTORY: Past Surgical History:  Procedure Laterality Date  . NO PAST SURGERIES      SOCIAL HISTORY: Social History   Socioeconomic History  . Marital status: Single    Spouse name: Not on file  . Number of children: 2  . Years of education: Not on file  . Highest education level: Not on file  Occupational History  . Not on file  Tobacco Use  . Smoking status: Never Smoker  . Smokeless tobacco: Never Used  Vaping Use  . Vaping Use: Never used  Substance and Sexual Activity  . Alcohol use: No  . Drug use: No  . Sexual activity: Not Currently    Birth control/protection: None  Other Topics Concern  . Not on file  Social History Narrative  . Not on file   Social Determinants of Health   Financial Resource Strain:   .  Difficulty of Paying Living Expenses:   Food Insecurity:   . Worried About Charity fundraiser in the Last Year:   . Arboriculturist in the Last Year:   Transportation Needs:   . Film/video editor (Medical):   Marland Kitchen Lack of Transportation (Non-Medical):   Physical Activity:   . Days of Exercise per Week:   . Minutes of Exercise per Session:   Stress:   . Feeling of Stress :   Social Connections:   . Frequency of Communication with Friends and Family:   . Frequency of Social Gatherings with Friends and Family:   . Attends  Religious Services:   . Active Member of Clubs or Organizations:   . Attends Archivist Meetings:   Marland Kitchen Marital Status:   Intimate Partner Violence:   . Fear of Current or Ex-Partner:   . Emotionally Abused:   Marland Kitchen Physically Abused:   . Sexually Abused:     FAMILY HISTORY: Family History  Problem Relation Age of Onset  . Diabetes Paternal Aunt   . Hypertension Paternal Aunt   . Diabetes Paternal Uncle   . Hypertension Paternal Uncle   . Anemia Mother   . Cancer Paternal Grandfather        Unknown type   . Breast cancer Maternal Aunt   . Anesthesia problems Neg Hx   . Hypotension Neg Hx   . Malignant hyperthermia Neg Hx   . Pseudochol deficiency Neg Hx   . Ovarian cancer Neg Hx   . Colon cancer Neg Hx     ALLERGIES:  has No Known Allergies.  MEDICATIONS:  Current Outpatient Medications  Medication Sig Dispense Refill  . Cholecalciferol 1.25 MG (50000 UT) capsule Take by mouth. Weekly    . ferrous sulfate 325 (65 FE) MG EC tablet Take 1 tablet (325 mg total) by mouth daily. (Patient not taking: Reported on 07/01/2019) 90 tablet 1  . vitamin B-12 (CYANOCOBALAMIN) 1000 MCG tablet Take 1 tablet (1,000 mcg total) by mouth daily. (Patient not taking: Reported on 12/20/2018) 90 tablet 0   No current facility-administered medications for this visit.     PHYSICAL EXAMINATION: ECOG PERFORMANCE STATUS: 0 - Asymptomatic Vitals:   07/01/19 1009  BP: 94/61  Pulse: 63  Resp: 16  Temp: (!) 96.7 F (35.9 C)   Filed Weights   07/01/19 1009  Weight: 190 lb 12.8 oz (86.5 kg)    Physical Exam Constitutional:      General: She is not in acute distress. HENT:     Head: Normocephalic and atraumatic.  Eyes:     General: No scleral icterus.    Pupils: Pupils are equal, round, and reactive to light.  Cardiovascular:     Rate and Rhythm: Normal rate and regular rhythm.     Heart sounds: Normal heart sounds.  Pulmonary:     Effort: Pulmonary effort is normal. No  respiratory distress.     Breath sounds: No wheezing.  Abdominal:     General: Bowel sounds are normal. There is no distension.     Palpations: Abdomen is soft. There is no mass.     Tenderness: There is no abdominal tenderness.  Musculoskeletal:        General: No deformity. Normal range of motion.     Cervical back: Normal range of motion and neck supple.  Skin:    General: Skin is warm and dry.     Findings: No erythema or rash.  Neurological:  Mental Status: She is alert and oriented to person, place, and time. Mental status is at baseline.     Cranial Nerves: No cranial nerve deficit.     Coordination: Coordination normal.  Psychiatric:        Mood and Affect: Mood normal.        Behavior: Behavior normal.        Thought Content: Thought content normal.      LABORATORY DATA:  I have reviewed the data as listed Lab Results  Component Value Date   WBC 5.5 06/29/2019   HGB 12.3 06/29/2019   HCT 36.8 06/29/2019   MCV 83.6 06/29/2019   PLT 279 06/29/2019   Recent Labs    08/18/18 1347 12/17/18 1320  NA 141  --   K 3.7  --   CL 108  --   CO2 25  --   GLUCOSE 96  --   BUN 12  --   CREATININE 0.83  --   CALCIUM 9.0  --   GFRNONAA >60  --   GFRAA >60  --   PROT 7.8 7.9  ALBUMIN 4.3 4.4  AST 95* 16  ALT 38 16  ALKPHOS 43 48  BILITOT 0.4 0.4  BILIDIR  --  <0.1  IBILI  --  NOT CALCULATED   Iron/TIBC/Ferritin/ %Sat    Component Value Date/Time   IRON 49 06/29/2019 1312   TIBC 449 06/29/2019 1312   FERRITIN 8 (L) 06/29/2019 1312   FERRITIN 4 (L) 09/24/2017 1420   IRONPCTSAT 11 06/29/2019 1312        ASSESSMENT & PLAN:  1. Other iron deficiency anemia   2. Menorrhagia with regular cycle   3. Low vitamin B12 level    Iron deficiency anemia, labs are reviewed and discussed with patient.# Hemoglobin has been stable. Ferritin is low at 8, saturation 11.  TIBC 449. This is compatible with iron deficiency. I recommend her to proceed with Venofer 200  mg next week and also repeat another dose in 5 weeks. She can follow-up in 6 months.  Menorrhagia, recommend patient to follow-up with OB/GYN for discussion of treatment for menorrhagia. History of Vitamin B12 deficiency recommend patient continue vitamin B12 supplementation.  I will check vitamin B12  at next visit.  Orders Placed This Encounter  Procedures  . CBC with Differential/Platelet    Standing Status:   Future    Standing Expiration Date:   06/30/2020  . Iron and TIBC    Standing Status:   Future    Standing Expiration Date:   06/30/2020  . Ferritin    Standing Status:   Future    Standing Expiration Date:   06/30/2020    All questions were answered. The patient knows to call the clinic with any problems questions or concerns.  Return of visit: 16 weeks  Rickard Patience, MD, PhD Hematology Oncology [facility] Pager- [phone] 07/01/2019

## 2019-07-01 NOTE — Progress Notes (Signed)
Patient reports energy has not improved.

## 2019-07-08 ENCOUNTER — Inpatient Hospital Stay: Payer: Medicaid Other

## 2019-07-13 ENCOUNTER — Inpatient Hospital Stay: Payer: Medicaid Other

## 2019-07-13 ENCOUNTER — Other Ambulatory Visit: Payer: Self-pay

## 2019-07-13 VITALS — BP 114/65 | HR 67 | Temp 97.3°F | Resp 18

## 2019-07-13 DIAGNOSIS — D508 Other iron deficiency anemias: Secondary | ICD-10-CM

## 2019-07-13 DIAGNOSIS — D500 Iron deficiency anemia secondary to blood loss (chronic): Secondary | ICD-10-CM | POA: Diagnosis not present

## 2019-07-13 MED ORDER — IRON SUCROSE 20 MG/ML IV SOLN
200.0000 mg | Freq: Once | INTRAVENOUS | Status: AC
  Administered 2019-07-13: 200 mg via INTRAVENOUS
  Filled 2019-07-13: qty 10

## 2019-07-13 MED ORDER — SODIUM CHLORIDE 0.9 % IV SOLN
Freq: Once | INTRAVENOUS | Status: AC
  Filled 2019-07-13: qty 250

## 2019-08-05 ENCOUNTER — Inpatient Hospital Stay: Payer: Medicaid Other | Attending: Oncology

## 2019-12-26 ENCOUNTER — Inpatient Hospital Stay: Payer: Medicaid Other

## 2019-12-27 ENCOUNTER — Telehealth: Payer: Self-pay

## 2019-12-27 NOTE — Telephone Encounter (Signed)
Done.. Pt appts have been R/S A NEW appt reminder letter will be mailed out

## 2019-12-27 NOTE — Telephone Encounter (Signed)
Pt scheduled for MD/ venofer tomorrow. Pt did not come for labs on 12/13. Will need to reschedule lab/MD/ venofer (labs prior)  unless she can come for labs tomorrow morning then she can keep MD/Venofer in the afternoon.

## 2019-12-28 ENCOUNTER — Inpatient Hospital Stay: Payer: Medicaid Other

## 2019-12-28 ENCOUNTER — Inpatient Hospital Stay: Payer: Medicaid Other | Admitting: Oncology

## 2020-01-23 ENCOUNTER — Inpatient Hospital Stay: Payer: Medicaid Other | Attending: Oncology

## 2020-01-24 ENCOUNTER — Telehealth: Payer: Self-pay

## 2020-01-24 NOTE — Telephone Encounter (Signed)
Pt scheduled for Mychart on 1/12 and poss venofer on 1/13. This is patient's 3rd no show to labs. Kimberly Salinas has r/s her in the past but unable to reach her. Appts have been mailed, but still no showed to labs. Please advise on rescheduling.

## 2020-01-24 NOTE — Telephone Encounter (Signed)
No need for rescheduling at his point given her multiple no shows and mailed appointment.

## 2020-01-25 ENCOUNTER — Inpatient Hospital Stay: Payer: Medicaid Other | Admitting: Oncology

## 2020-01-25 NOTE — Telephone Encounter (Signed)
Done

## 2020-01-25 NOTE — Telephone Encounter (Addendum)
Kimberly Salinas please cancel today's Mychart visit and infusion scheduled for tomorrow.

## 2020-01-26 ENCOUNTER — Inpatient Hospital Stay: Payer: Medicaid Other

## 2020-01-26 ENCOUNTER — Telehealth: Payer: Self-pay | Admitting: Oncology

## 2020-01-26 NOTE — Telephone Encounter (Signed)
01/26/2020 Pt called and stated she missed labs on 1/10, therefore MD and +/- Venofer needed to be r/s.  New appts made for 2/11, 2/14 and 2/15. Pt confirmed days and times SRW

## 2020-02-24 ENCOUNTER — Inpatient Hospital Stay: Payer: Medicaid Other | Attending: Oncology

## 2020-02-24 DIAGNOSIS — D508 Other iron deficiency anemias: Secondary | ICD-10-CM

## 2020-02-24 DIAGNOSIS — Z79899 Other long term (current) drug therapy: Secondary | ICD-10-CM | POA: Diagnosis not present

## 2020-02-24 DIAGNOSIS — E538 Deficiency of other specified B group vitamins: Secondary | ICD-10-CM

## 2020-02-24 DIAGNOSIS — D509 Iron deficiency anemia, unspecified: Secondary | ICD-10-CM | POA: Diagnosis present

## 2020-02-24 DIAGNOSIS — N920 Excessive and frequent menstruation with regular cycle: Secondary | ICD-10-CM

## 2020-02-24 LAB — CBC WITH DIFFERENTIAL/PLATELET
Abs Immature Granulocytes: 0.02 10*3/uL (ref 0.00–0.07)
Basophils Absolute: 0 10*3/uL (ref 0.0–0.1)
Basophils Relative: 0 %
Eosinophils Absolute: 0.1 10*3/uL (ref 0.0–0.5)
Eosinophils Relative: 1 %
HCT: 37.9 % (ref 36.0–46.0)
Hemoglobin: 12.5 g/dL (ref 12.0–15.0)
Immature Granulocytes: 0 %
Lymphocytes Relative: 28 %
Lymphs Abs: 2.1 10*3/uL (ref 0.7–4.0)
MCH: 27.7 pg (ref 26.0–34.0)
MCHC: 33 g/dL (ref 30.0–36.0)
MCV: 84 fL (ref 80.0–100.0)
Monocytes Absolute: 0.5 10*3/uL (ref 0.1–1.0)
Monocytes Relative: 7 %
Neutro Abs: 4.8 10*3/uL (ref 1.7–7.7)
Neutrophils Relative %: 64 %
Platelets: 334 10*3/uL (ref 150–400)
RBC: 4.51 MIL/uL (ref 3.87–5.11)
RDW: 12.9 % (ref 11.5–15.5)
WBC: 7.6 10*3/uL (ref 4.0–10.5)
nRBC: 0 % (ref 0.0–0.2)

## 2020-02-24 LAB — COMPREHENSIVE METABOLIC PANEL
ALT: 14 U/L (ref 0–44)
AST: 20 U/L (ref 15–41)
Albumin: 4.2 g/dL (ref 3.5–5.0)
Alkaline Phosphatase: 53 U/L (ref 38–126)
Anion gap: 10 (ref 5–15)
BUN: 10 mg/dL (ref 6–20)
CO2: 24 mmol/L (ref 22–32)
Calcium: 9.1 mg/dL (ref 8.9–10.3)
Chloride: 105 mmol/L (ref 98–111)
Creatinine, Ser: 0.84 mg/dL (ref 0.44–1.00)
GFR, Estimated: >60 mL/min (ref >60)
Glucose, Bld: 88 mg/dL (ref 70–99)
Potassium: 3.9 mmol/L (ref 3.5–5.1)
Sodium: 139 mmol/L (ref 135–145)
Total Bilirubin: 0.2 mg/dL — ABNORMAL LOW (ref 0.3–1.2)
Total Protein: 8 g/dL (ref 6.5–8.1)

## 2020-02-24 LAB — IRON AND TIBC
Iron: 44 ug/dL (ref 28–170)
Saturation Ratios: 9 % — ABNORMAL LOW (ref 10.4–31.8)
TIBC: 469 ug/dL — ABNORMAL HIGH (ref 250–450)
UIBC: 425 ug/dL

## 2020-02-24 LAB — FERRITIN: Ferritin: 7 ng/mL — ABNORMAL LOW (ref 11–307)

## 2020-02-24 LAB — VITAMIN B12: Vitamin B-12: 364 pg/mL (ref 180–914)

## 2020-02-27 ENCOUNTER — Encounter: Payer: Self-pay | Admitting: Oncology

## 2020-02-27 ENCOUNTER — Ambulatory Visit: Payer: Medicaid Other

## 2020-02-27 ENCOUNTER — Inpatient Hospital Stay: Payer: Medicaid Other | Attending: Oncology | Admitting: Oncology

## 2020-02-27 DIAGNOSIS — D508 Other iron deficiency anemias: Secondary | ICD-10-CM

## 2020-02-27 NOTE — Progress Notes (Signed)
Patient contacted for Mychart visit. No new concerns voiced.  

## 2020-02-27 NOTE — Progress Notes (Signed)
HEMATOLOGY-ONCOLOGY TeleHEALTH VISIT PROGRESS NOTE  I connected with Kimberly Salinas on 02/27/20  at  1:45 PM EST by video enabled telemedicine visit and verified that I am speaking with the correct person using two identifiers. I discussed the limitations, risks, security and privacy concerns of performing an evaluation and management service by telemedicine and the availability of in-person appointments. The patient expressed understanding and agreed to proceed.   Other persons participating in the visit and their role in the encounter:  None  Patient's location: Patient was in her vehicle.  She was not driving. Provider's location: office Chief Complaint: Iron deficiency anemia   INTERVAL HISTORY Kimberly Salinas is a 30 y.o. female who has above history reviewed by me today presents for follow up visit for management of iron deficiency anemia. Problems and complaints are listed below:  Patient was last seen by me on 07/04/19.  Patient has had IV Venofer treatments in the past. Reports infrequent.  Chronic menorrhagia.  Review of Systems  Constitutional: Positive for fatigue. Negative for appetite change, chills and fever.  HENT:   Negative for hearing loss and voice change.   Eyes: Negative for eye problems.  Respiratory: Negative for chest tightness and cough.   Cardiovascular: Negative for chest pain.  Gastrointestinal: Negative for abdominal distention, abdominal pain and blood in stool.  Endocrine: Negative for hot flashes.  Genitourinary: Negative for difficulty urinating and frequency.   Musculoskeletal: Negative for arthralgias.  Skin: Negative for itching and rash.  Neurological: Negative for extremity weakness.  Hematological: Negative for adenopathy.  Psychiatric/Behavioral: Negative for confusion.    Past Medical History:  Diagnosis Date  . Anemia    Past Surgical History:  Procedure Laterality Date  . NO PAST SURGERIES      Family History  Problem  Relation Age of Onset  . Diabetes Paternal Aunt   . Hypertension Paternal Aunt   . Diabetes Paternal Uncle   . Hypertension Paternal Uncle   . Anemia Mother   . Cancer Paternal Grandfather        Unknown type   . Breast cancer Maternal Aunt   . Anesthesia problems Neg Hx   . Hypotension Neg Hx   . Malignant hyperthermia Neg Hx   . Pseudochol deficiency Neg Hx   . Ovarian cancer Neg Hx   . Colon cancer Neg Hx     Social History   Socioeconomic History  . Marital status: Single    Spouse name: Not on file  . Number of children: 2  . Years of education: Not on file  . Highest education level: Not on file  Occupational History  . Not on file  Tobacco Use  . Smoking status: Never Smoker  . Smokeless tobacco: Never Used  Vaping Use  . Vaping Use: Never used  Substance and Sexual Activity  . Alcohol use: No  . Drug use: No  . Sexual activity: Not Currently    Birth control/protection: None  Other Topics Concern  . Not on file  Social History Narrative  . Not on file   Social Determinants of Health   Financial Resource Strain: Not on file  Food Insecurity: Not on file  Transportation Needs: Not on file  Physical Activity: Not on file  Stress: Not on file  Social Connections: Not on file  Intimate Partner Violence: Not on file    Current Outpatient Medications on File Prior to Visit  Medication Sig Dispense Refill  . Cholecalciferol 1.25 MG (50000 UT) capsule  Take by mouth. Weekly    . ferrous sulfate 325 (65 FE) MG EC tablet Take 1 tablet (325 mg total) by mouth daily. (Patient not taking: No sig reported) 90 tablet 1  . vitamin B-12 (CYANOCOBALAMIN) 1000 MCG tablet Take 1 tablet (1,000 mcg total) by mouth daily. (Patient not taking: No sig reported) 90 tablet 0   No current facility-administered medications on file prior to visit.    No Known Allergies     Observations/Objective: Today's Vitals   02/27/20 1341  PainSc: 0-No pain   There is no height or  weight on file to calculate BMI.  Physical Exam Neurological:     Mental Status: She is alert.     CBC    Component Value Date/Time   WBC 7.6 02/24/2020 1511   RBC 4.51 02/24/2020 1511   HGB 12.5 02/24/2020 1511   HGB 7.5 (L) 10/21/2017 0940   HCT 37.9 02/24/2020 1511   HCT 23.2 (L) 10/21/2017 0940   PLT 334 02/24/2020 1511   PLT 233 10/21/2017 0940   MCV 84.0 02/24/2020 1511   MCV 78 (L) 10/21/2017 0940   MCV 74 (L) 10/06/2013 0833   MCH 27.7 02/24/2020 1511   MCHC 33.0 02/24/2020 1511   RDW 12.9 02/24/2020 1511   RDW 15.1 10/21/2017 0940   RDW 17.9 (H) 10/06/2013 0833   LYMPHSABS 2.1 02/24/2020 1511   LYMPHSABS 1.3 06/05/2017 0955   LYMPHSABS 1.8 10/06/2013 0833   MONOABS 0.5 02/24/2020 1511   MONOABS 0.6 10/06/2013 0833   EOSABS 0.1 02/24/2020 1511   EOSABS 0.1 06/05/2017 0955   EOSABS 0.0 10/06/2013 0833   BASOSABS 0.0 02/24/2020 1511   BASOSABS 0.0 06/05/2017 0955   BASOSABS 0.0 10/06/2013 0833    CMP     Component Value Date/Time   NA 139 02/24/2020 1511   K 3.9 02/24/2020 1511   CL 105 02/24/2020 1511   CO2 24 02/24/2020 1511   GLUCOSE 88 02/24/2020 1511   BUN 10 02/24/2020 1511   CREATININE 0.84 02/24/2020 1511   CALCIUM 9.1 02/24/2020 1511   PROT 8.0 02/24/2020 1511   ALBUMIN 4.2 02/24/2020 1511   AST 20 02/24/2020 1511   ALT 14 02/24/2020 1511   ALKPHOS 53 02/24/2020 1511   BILITOT 0.2 (L) 02/24/2020 1511   GFRNONAA >60 02/24/2020 1511   GFRAA >60 08/18/2018 1347     Assessment and Plan: 1. Other iron deficiency anemia     Labs reviewed and discussed with patient. Chronically iron deficient with ferritin of 7, iron saturation 9, increased TIBC 469. Hemoglobin has been normal at 12.5. Recommend patient to proceed with Venofer 200 mg weekly x3.  Follow Up Instructions: Follow-up in 6 months.   I discussed the assessment and treatment plan with the patient. The patient was provided an opportunity to ask questions and all were answered. The  patient agreed with the plan and demonstrated an understanding of the instructions.  The patient was advised to call back or seek an in-person evaluation if the symptoms worsen or if the condition fails to improve as anticipated.    Rickard Patience, MD 02/27/2020 8:15 PM

## 2020-02-28 ENCOUNTER — Inpatient Hospital Stay: Payer: Medicaid Other

## 2020-02-28 VITALS — BP 105/67 | HR 58 | Temp 97.2°F | Resp 16

## 2020-02-28 DIAGNOSIS — D508 Other iron deficiency anemias: Secondary | ICD-10-CM

## 2020-02-28 DIAGNOSIS — D509 Iron deficiency anemia, unspecified: Secondary | ICD-10-CM | POA: Diagnosis not present

## 2020-02-28 MED ORDER — SODIUM CHLORIDE 0.9 % IV SOLN
Freq: Once | INTRAVENOUS | Status: AC
  Filled 2020-02-28: qty 250

## 2020-02-28 MED ORDER — IRON SUCROSE 20 MG/ML IV SOLN
200.0000 mg | Freq: Once | INTRAVENOUS | Status: AC
  Administered 2020-02-28: 200 mg via INTRAVENOUS
  Filled 2020-02-28: qty 10

## 2020-02-28 NOTE — Progress Notes (Signed)
Patient tolerated infusion well. Patient and VSS. Discharged home  

## 2020-03-09 ENCOUNTER — Inpatient Hospital Stay: Payer: Medicaid Other

## 2020-03-11 IMAGING — US US MFM OB COMP +14 WKS
1 series · 14 of 28 positions shown · non-contrast
Comparison: none

[Series 1: us mfm ob comp +14 wks · 14 of 93 slices shown]
[im 4/93]
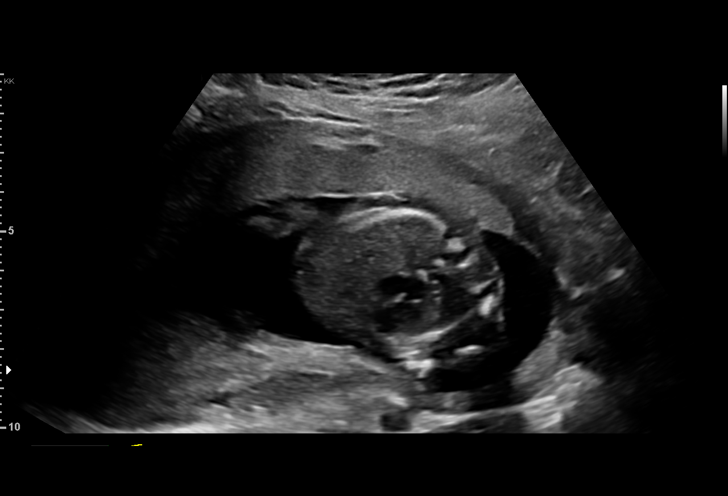
[im 11/93]
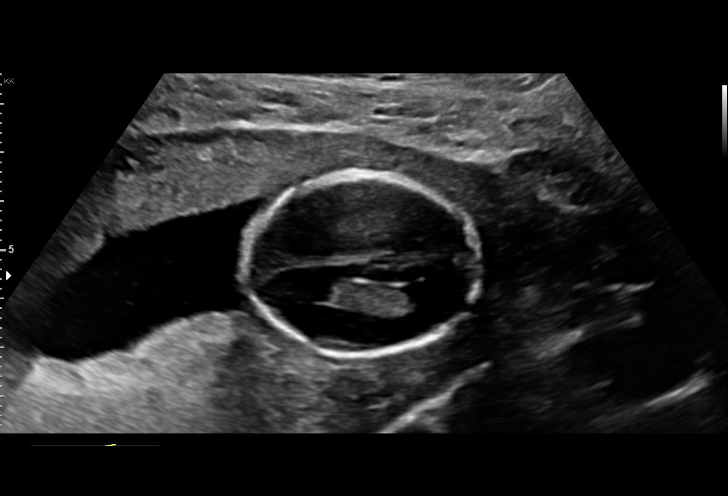
[im 18/93]
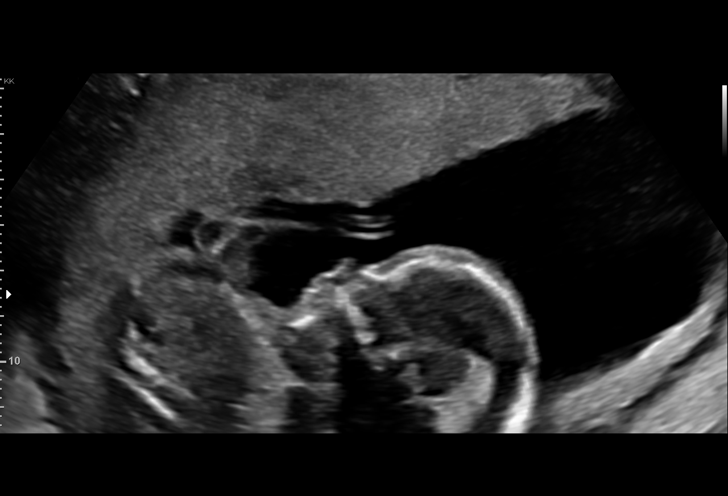
[im 24/93]
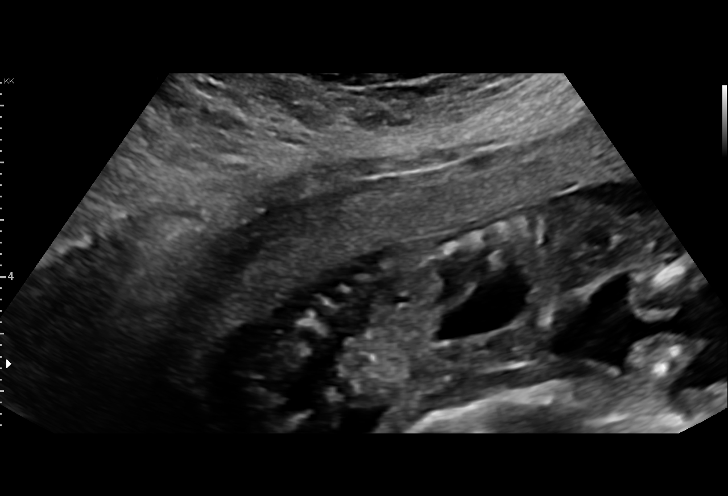
[im 31/93]
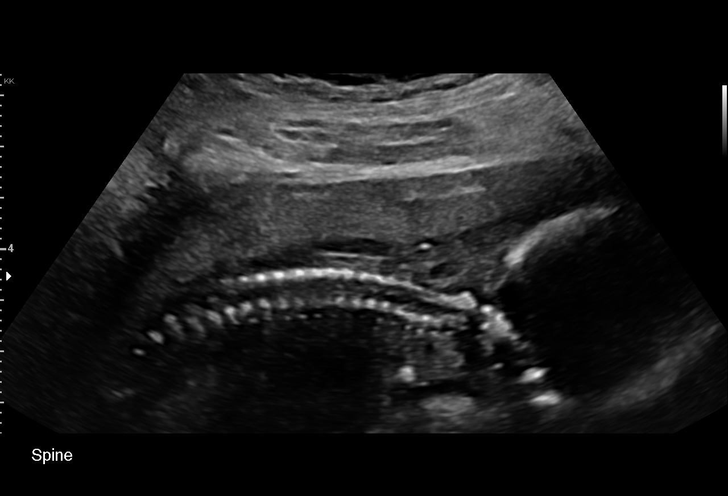
[im 38/93]
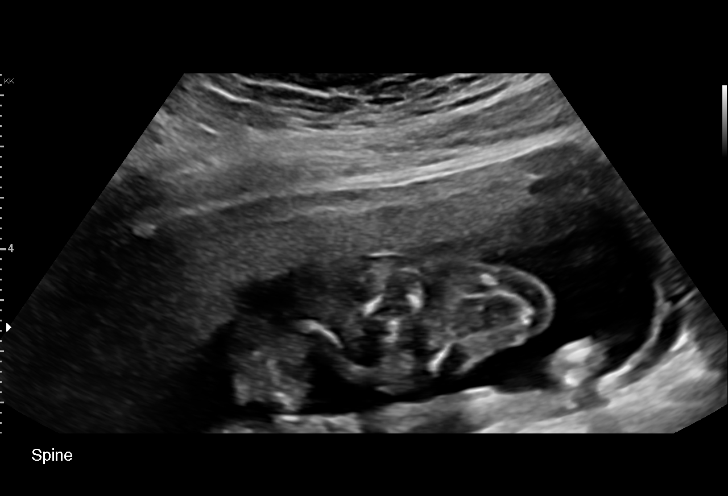
[im 45/93]
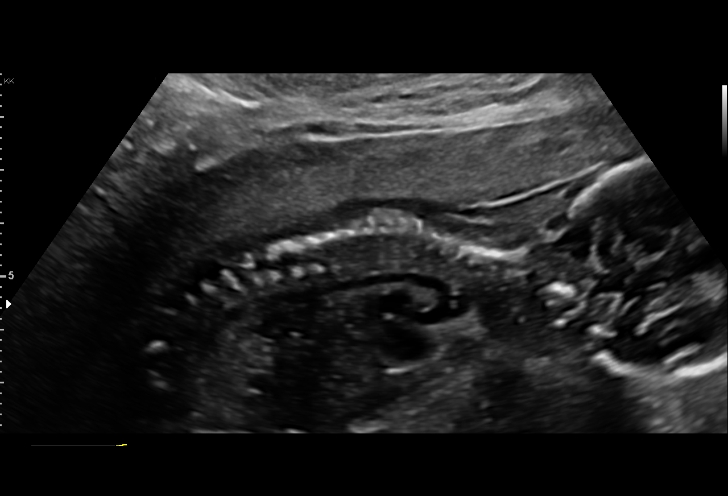
[im 52/93]
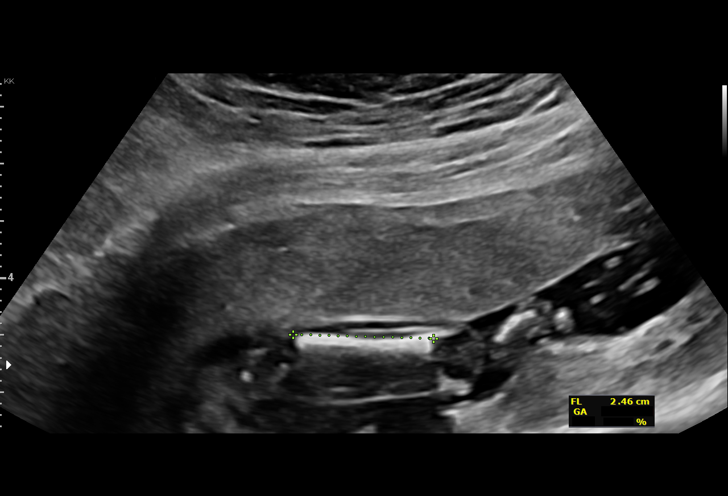
[im 58/93]
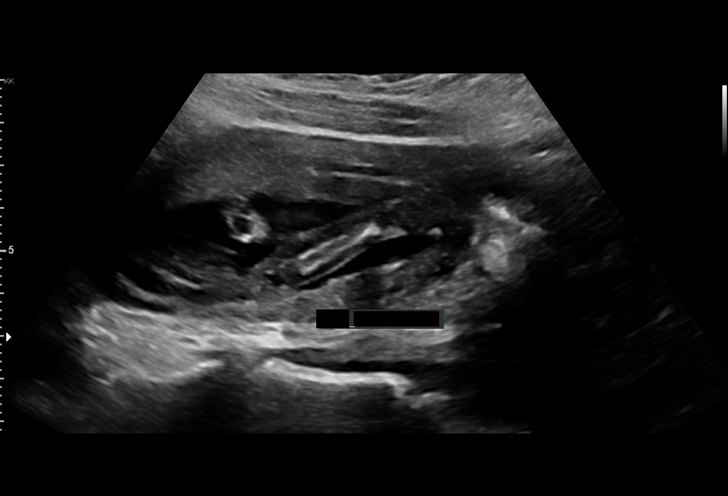
[im 65/93]
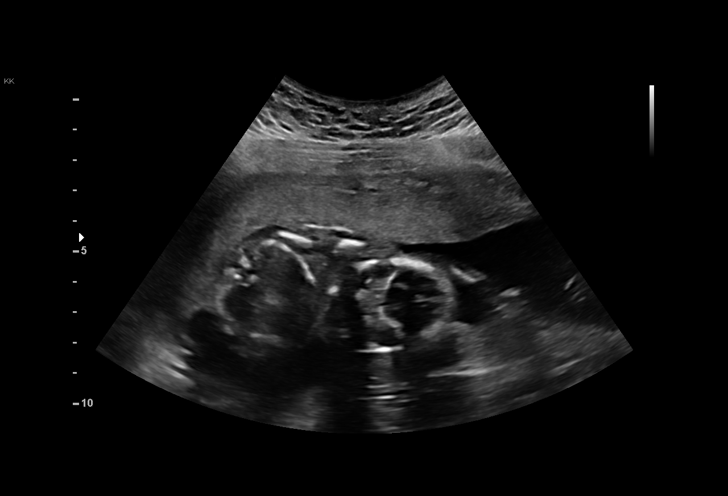
[im 72/93]
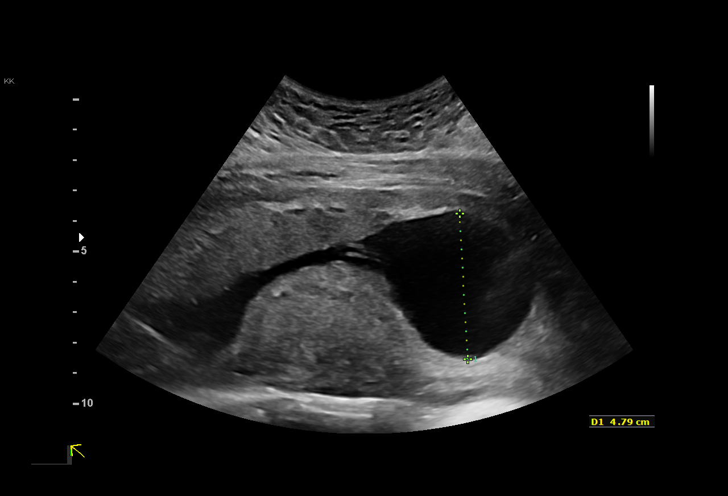
[im 79/93]
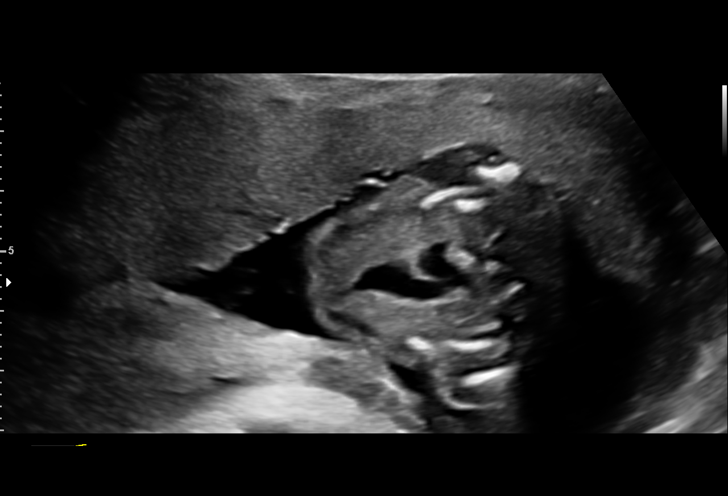
[im 86/93]
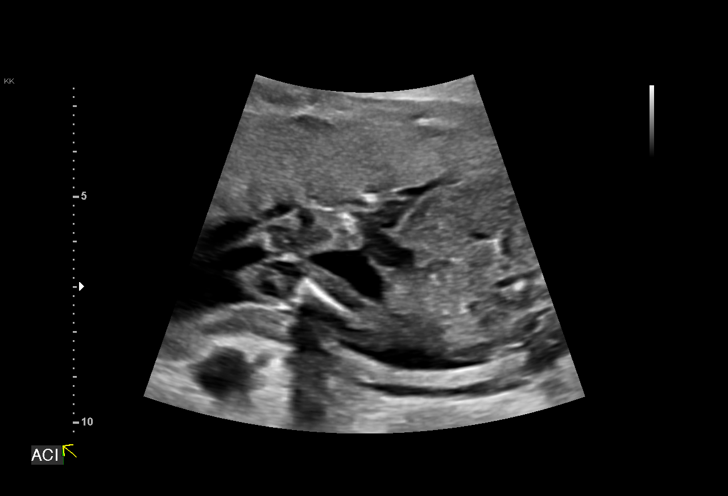
[im 93/93]
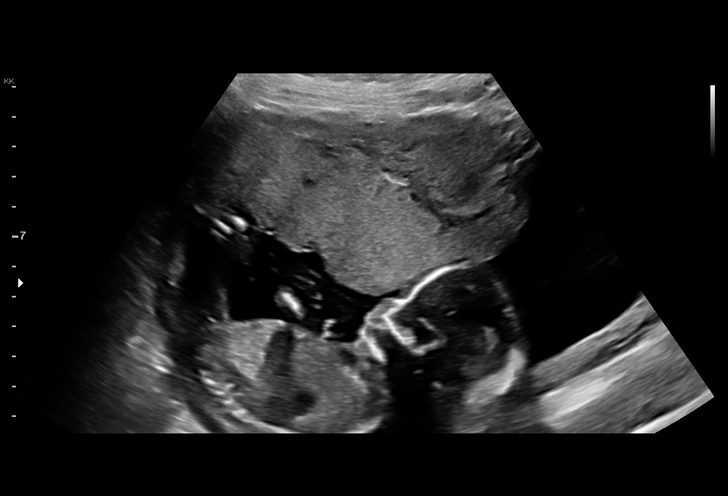

[14 of 28 positions shown; findings below may reference images not displayed]

Road [HOSPITAL]

Indications

18 weeks gestation of pregnancy
Encounter for antenatal screening for
malformations
OB History

Blood Type:            Height:  5'3"   Weight (lb):  162       BMI:
Gravidity:    3         Term:   2        Prem:   0        SAB:   0
TOP:          0       Ectopic:  0        Living: 2
Fetal Evaluation

Num Of Fetuses:     1
Fetal Heart         157
Rate(bpm):
Cardiac Activity:   Observed
Presentation:       Cephalic
Placenta:           Anterior, above cervical os
P. Cord Insertion:  Visualized

Amniotic Fluid
AFI FV:      Subjectively within normal limits

Largest Pocket(cm)
4.8
Biometry

BPD:      38.2  mm     G. Age:  17w 5d         28  %    CI:        64.33   %    70 - 86
FL/HC:      16.3   %    15.8 - 18
HC:      153.2  mm     G. Age:  18w 2d         51  %    HC/AC:      1.10        1.07 -
AC:      139.9  mm     G. Age:  19w 3d         84  %    FL/BPD:     65.2   %
FL:       24.9  mm     G. Age:  17w 4d         23  %    FL/AC:      17.8   %    20 - 24
NFT:         3  mm

Est. FW:     241  gm      0 lb 9 oz     54  %
Gestational Age

LMP:           18w 1d        Date:  02/22/17                 EDD:   11/29/17
U/S Today:     18w 2d                                        EDD:   11/28/17
Best:          18w 1d     Det. By:  LMP  (02/22/17)          EDD:   11/29/17
Anatomy

Cranium:               Appears normal         Aortic Arch:            Appears normal
Cavum:                 Appears normal         Ductal Arch:            Not well visualized
Ventricles:            Appears normal         Diaphragm:              Appears normal
Choroid Plexus:        Appears normal         Stomach:                Appears normal, left
sided
Cerebellum:            Appears normal         Abdomen:                Appears normal
Posterior Fossa:       Appears normal         Abdominal Wall:         Appears nml (cord
insert, abd wall)
Nuchal Fold:           Appears normal         Cord Vessels:           Appears normal (3
vessel cord)
Face:                  Not well visualized    Kidneys:                Appear normal
Lips:                  Not well visualized    Bladder:                Appears normal
Thoracic:              Appears normal         Spine:                  Appears normal
Heart:                 Not well visualized    Upper Extremities:      Appears normal
RVOT:                  Appears normal         Lower Extremities:      Appears normal
LVOT:                  Not well visualized

Other:  Parents do not wish to know sex of fetus.Female gender. Nasal bone
visualized. Technically difficult due to maternal habitus and fetal
position.
Cervix Uterus Adnexa

Cervix
Length:            3.1  cm.
Normal appearance by transabdominal scan.
Impression

Singleton intrauterine pregnancy at 18+1 weeks here for
anatomic survey
Review of the anatomy shows no sonographic markers for
aneuploidy or structural anomalies
However, views of the fetal face and heart should be
considered suboptimal secondary to maternal body habitus
and fetal position
Amniotic fluid volume is normal
Estimated fetal weight shows growth in the 54th percentile
Recommendations

Recommend follow-up ultrasound examination in 4 weeks for
completion of the anatomic survey

## 2020-03-16 ENCOUNTER — Inpatient Hospital Stay: Payer: Medicaid Other | Attending: Oncology

## 2020-04-29 IMAGING — US US MFM OB FOLLOW-UP
1 series · 14 of 28 positions shown · non-contrast
Comparison: none

[Series 1: us mfm ob follow-up · 14 of 65 slices shown]
[im 3/65]
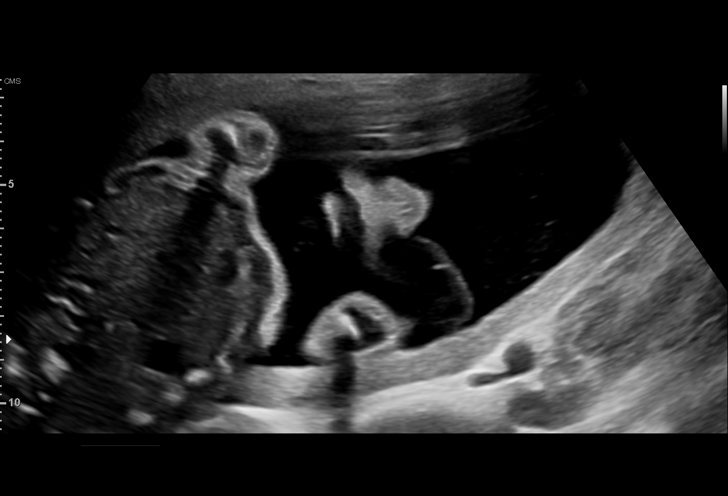
[im 8/65]
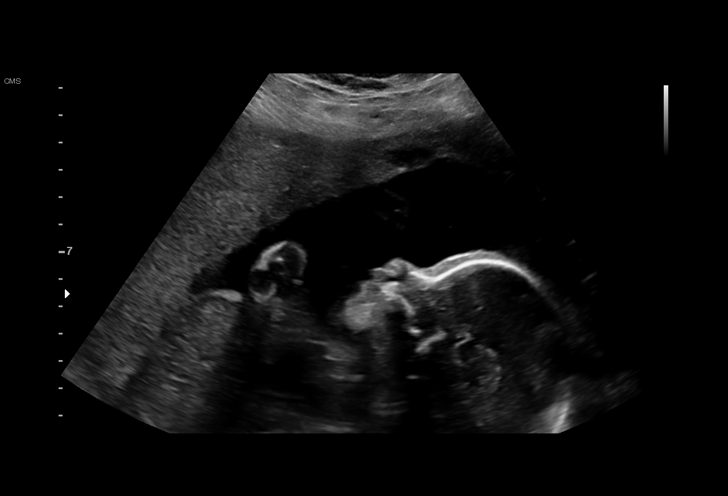
[im 12/65]
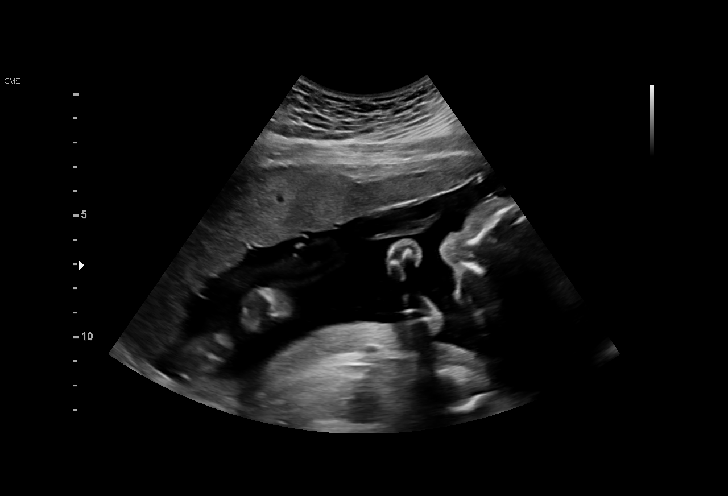
[im 17/65]
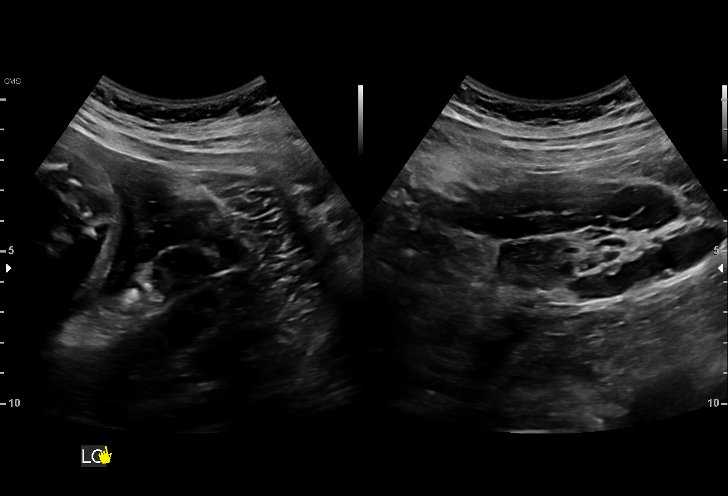
[im 22/65]
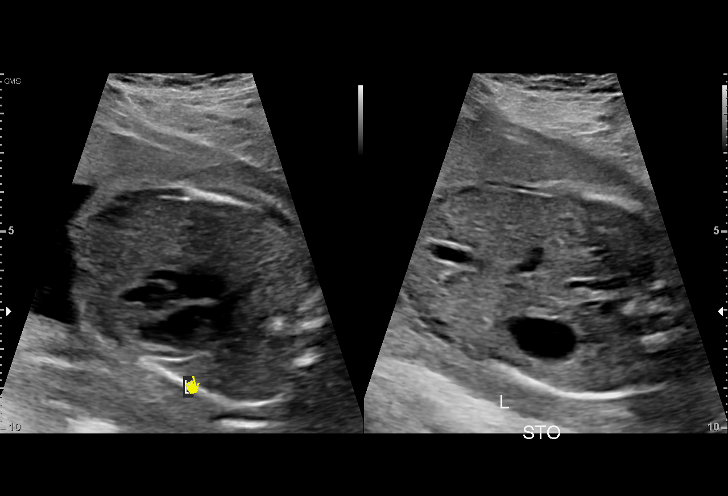
[im 27/65]
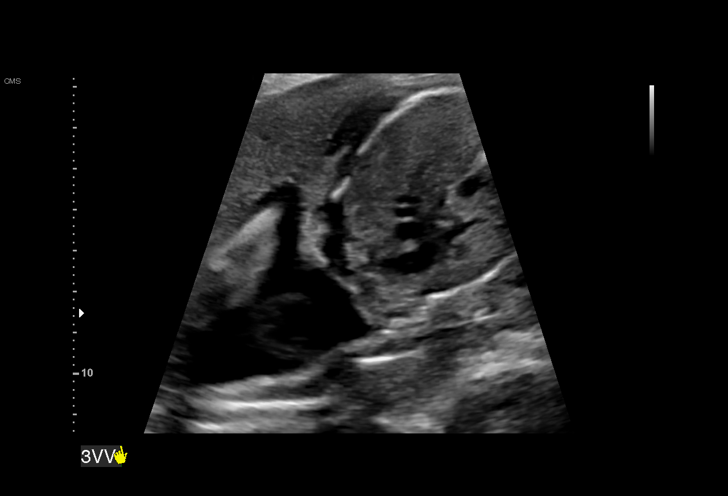
[im 31/65]
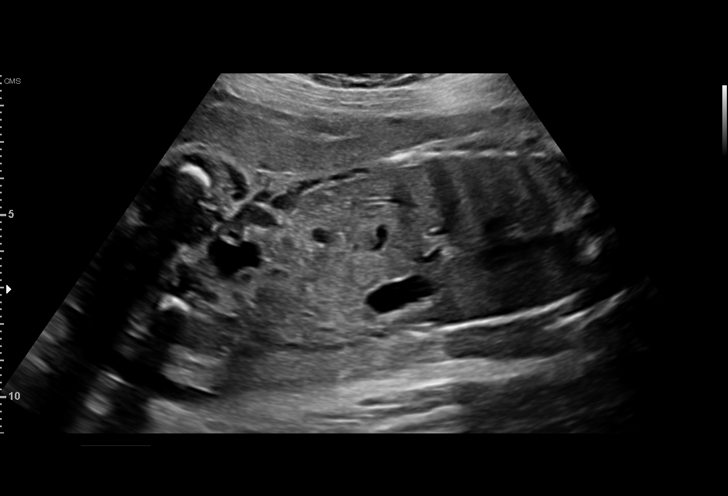
[im 36/65]
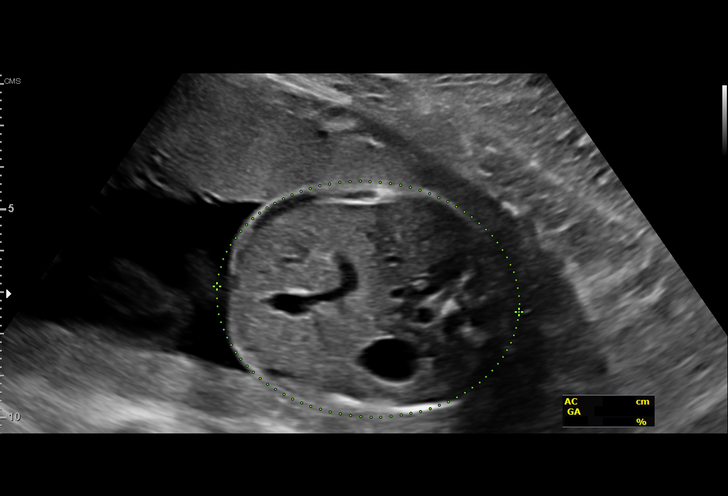
[im 41/65]
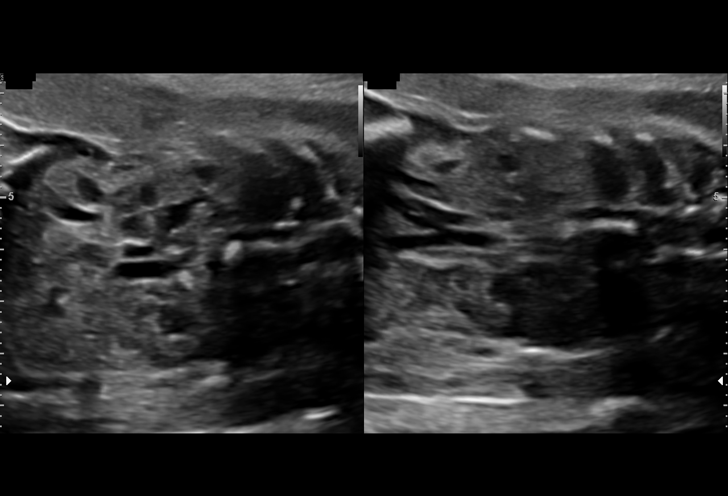
[im 46/65]
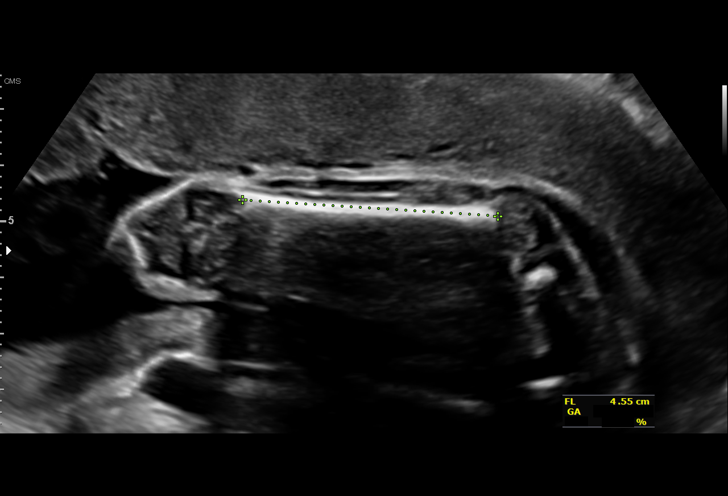
[im 50/65]
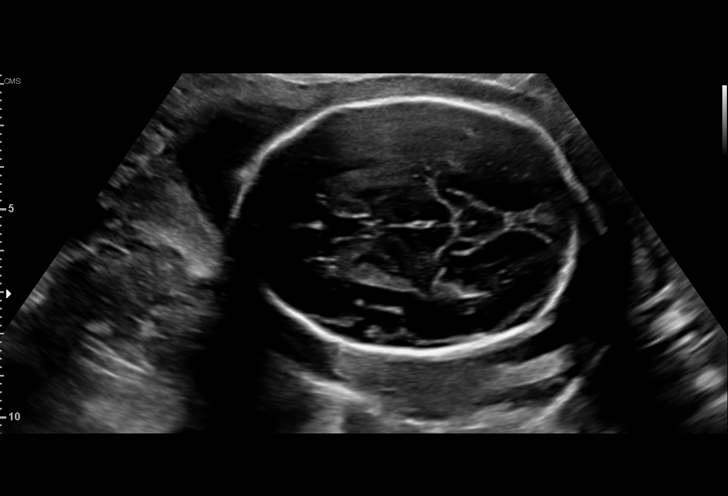
[im 55/65]
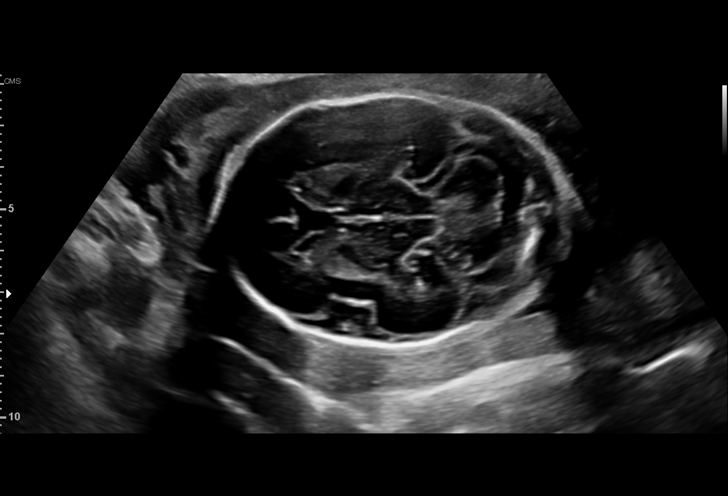
[im 60/65]
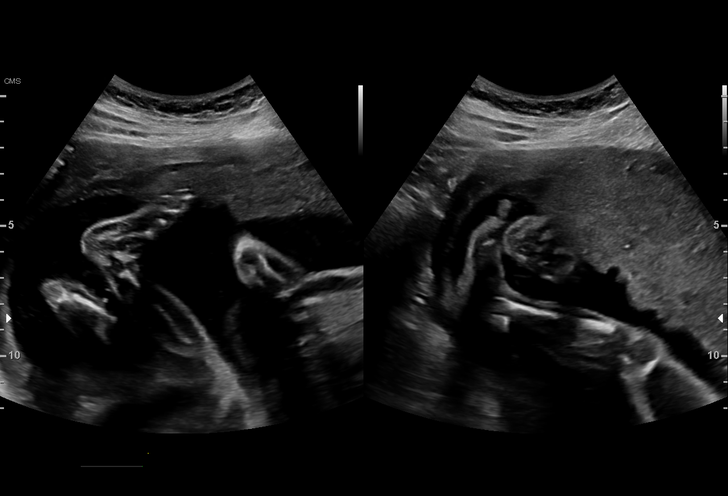
[im 65/65]
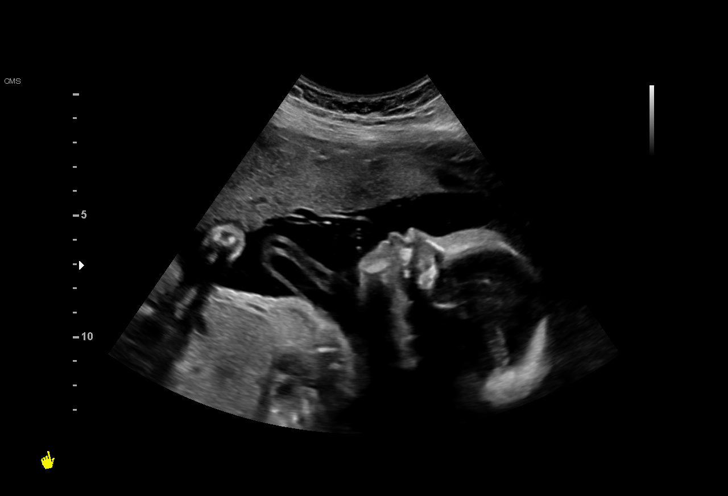

[14 of 28 positions shown; findings below may reference images not displayed]

Road [HOSPITAL]

1  KAKI JIM           [id number]       [id number]     [id number]
Indications

25 weeks gestation of pregnancy
Antenatal follow-up for nonvisualized fetal
anatomy
OB History

Blood Type:            Height:  5'3"   Weight (lb):  162       BMI:
Gravidity:    3         Term:   2        Prem:   0        SAB:   0
TOP:          0       Ectopic:  0        Living: 2
Fetal Evaluation

Num Of Fetuses:     1
Fetal Heart         135
Rate(bpm):
Cardiac Activity:   Observed
Presentation:       Cephalic
Placenta:           Anterior
P. Cord Insertion:  Visualized

Amniotic Fluid
AFI FV:      Subjectively within normal limits

Largest Pocket(cm)
4.54
Biometry

BPD:      60.8  mm     G. Age:  24w 5d         28  %    CI:         67.3   %    70 - 86
FL/HC:      19.1   %    18.7 -
HC:      237.3  mm     G. Age:  25w 6d         51  %    HC/AC:      1.14        1.04 -
AC:      207.3  mm     G. Age:  25w 2d         46  %    FL/BPD:     74.7   %    71 - 87
FL:       45.4  mm     G. Age:  25w 0d         33  %    FL/AC:      21.9   %    20 - 24
HUM:      40.8  mm     G. Age:  24w 5d         33  %

Est. FW:     783  gm    1 lb 12 oz      54  %
Gestational Age

LMP:           25w 1d        Date:  02/22/17                 EDD:   11/29/17
U/S Today:     25w 2d                                        EDD:   11/28/17
Best:          25w 1d     Det. By:  LMP  (02/22/17)          EDD:   11/29/17
Anatomy

Cranium:               Appears normal         Aortic Arch:            Appears normal
Cavum:                 Appears normal         Ductal Arch:            Appears normal
Ventricles:            Appears normal         Diaphragm:              Appears normal
Choroid Plexus:        Appears normal         Stomach:                Appears normal, left
sided
Cerebellum:            Appears normal         Abdomen:                Appears normal
Posterior Fossa:       Appears normal         Abdominal Wall:         Appears nml (cord
insert, abd wall)
Nuchal Fold:           Previously seen        Cord Vessels:           Appears normal (3
vessel cord)
Face:                  Appears normal         Kidneys:                Appear normal
(orbits and profile)
Lips:                  Appears normal         Bladder:                Appears normal
Thoracic:              Appears normal         Spine:                  Previously seen
Heart:                 Appears normal         Upper Extremities:      Previously seen
(4CH, axis, and
situs)
RVOT:                  Appears normal         Lower Extremities:      Previously seen
LVOT:                  Appears normal

Other:  Nasal bone visualized. Heels visualized.
Cervix Uterus Adnexa

Cervix
Length:            4.2  cm.
Normal appearance by transabdominal scan.

Uterus
No abnormality visualized.

Left Ovary
Within normal limits.

Right Ovary
Within normal limits.

Cul De Sac:   No free fluid seen.

Adnexa:       No abnormality visualized.
Impression

Patient returned for completion of fetal anatomy. Amniotic
fluid is normal and good fetal activity is seen. Fetal biometry
is consistent with her previously-established dates.
Cardiac anatomy appears normal.
Recommendations
Follow-up scans as clinically indicated.

## 2020-08-27 ENCOUNTER — Inpatient Hospital Stay: Payer: Medicaid Other | Attending: Oncology

## 2020-08-29 ENCOUNTER — Other Ambulatory Visit: Payer: Self-pay

## 2020-08-29 ENCOUNTER — Inpatient Hospital Stay: Payer: Medicaid Other | Admitting: Oncology

## 2020-08-30 ENCOUNTER — Inpatient Hospital Stay: Payer: Medicaid Other

## 2020-09-01 ENCOUNTER — Encounter: Payer: Self-pay | Admitting: Oncology

## 2020-09-01 NOTE — Progress Notes (Signed)
err

## 2020-09-03 ENCOUNTER — Inpatient Hospital Stay: Payer: Medicaid Other | Admitting: Oncology

## 2020-09-03 ENCOUNTER — Ambulatory Visit: Payer: Medicaid Other

## 2020-09-04 ENCOUNTER — Inpatient Hospital Stay: Payer: Medicaid Other

## 2020-09-18 ENCOUNTER — Inpatient Hospital Stay: Payer: Medicaid Other | Attending: Oncology

## 2020-09-19 ENCOUNTER — Inpatient Hospital Stay: Payer: Medicaid Other | Admitting: Oncology

## 2020-09-20 ENCOUNTER — Inpatient Hospital Stay: Payer: Medicaid Other

## 2021-06-13 ENCOUNTER — Encounter: Payer: Self-pay | Admitting: Oncology

## 2021-06-18 ENCOUNTER — Ambulatory Visit: Payer: Medicaid Other | Admitting: Podiatry

## 2021-06-18 DIAGNOSIS — M2041 Other hammer toe(s) (acquired), right foot: Secondary | ICD-10-CM | POA: Diagnosis not present

## 2021-06-18 DIAGNOSIS — M2042 Other hammer toe(s) (acquired), left foot: Secondary | ICD-10-CM | POA: Diagnosis not present

## 2021-06-19 NOTE — Progress Notes (Signed)
  Subjective:  Patient ID: Kimberly Salinas, female    DOB: 12/30/1990,  MRN: [id number]  Chief Complaint  Patient presents with   Toe Pain    31 y.o. female presents with the above complaint.  Patient presents with complaint bilateral mild hammertoes.  She has these bump at the joints of the hammertoe likely due to use small shoes and narrow shoes and pressure.  She states going on for quite some time has progressive gotten worse with mild pain 1 out of 10.  She states it bothers her.  She wanted to discuss treatment options including surgical options if any.  She has not seen anyone else prior to seeing me for this.  She denies any other acute complaints.   Review of Systems: Negative except as noted in the HPI. Denies N/V/F/Ch.  Past Medical History:  Diagnosis Date   Anemia     Current Outpatient Medications:    Cholecalciferol 1.25 MG (50000 UT) capsule, Take by mouth. Weekly, Disp: , Rfl:    ferrous sulfate 325 (65 FE) MG EC tablet, Take 1 tablet (325 mg total) by mouth daily. (Patient not taking: No sig reported), Disp: 90 tablet, Rfl: 1   vitamin B-12 (CYANOCOBALAMIN) 1000 MCG tablet, Take 1 tablet (1,000 mcg total) by mouth daily. (Patient not taking: No sig reported), Disp: 90 tablet, Rfl: 0  Social History   Tobacco Use  Smoking Status Never  Smokeless Tobacco Never    No Known Allergies Objective:  There were no vitals filed for this visit. There is no height or weight on file to calculate BMI. Constitutional Well developed. Well nourished.  Vascular Dorsalis pedis pulses palpable bilaterally. Posterior tibial pulses palpable bilaterally. Capillary refill normal to all digits.  No cyanosis or clubbing noted. Pedal hair growth normal.  Neurologic Normal speech. Oriented to person, place, and time. Epicritic sensation to light touch grossly present bilaterally.  Dermatologic Nails within normal limits Skin within normal limits  Orthopedic: Bilateral mild  hammertoe contracture at the DIPJ and PIPJ joint of each digits.  Semiflexible in nature no pain on palpation to the hammertoe contracture   Radiographs: None Assessment:   1. Hammertoe, bilateral    Plan:  Patient was evaluated and treated and all questions answered.  Bilateral 2 through 5 hammertoe contracture -All questions and concerns were discussed with the patient in extensive detail.  Given this is very mild deformity with no pain.  I believe patient will benefit from shoe gear modification padding protecting offloading.  If she fails conservative care we will discuss surgical options at that time.  She states understanding.  No follow-ups on file.

## 2022-09-18 DIAGNOSIS — E282 Polycystic ovarian syndrome: Secondary | ICD-10-CM | POA: Insufficient documentation

## 2022-09-18 DIAGNOSIS — Z6834 Body mass index (BMI) 34.0-34.9, adult: Secondary | ICD-10-CM | POA: Insufficient documentation

## 2022-09-18 DIAGNOSIS — R7303 Prediabetes: Secondary | ICD-10-CM | POA: Insufficient documentation

## 2023-04-16 ENCOUNTER — Ambulatory Visit: Admitting: Family Medicine

## 2023-04-21 ENCOUNTER — Encounter: Payer: Self-pay | Admitting: Family Medicine

## 2023-04-21 ENCOUNTER — Ambulatory Visit (INDEPENDENT_AMBULATORY_CARE_PROVIDER_SITE_OTHER): Admitting: Family Medicine

## 2023-04-21 VITALS — BP 112/72 | HR 86 | Temp 98.0°F | Ht 64.0 in | Wt 216.8 lb

## 2023-04-21 DIAGNOSIS — E282 Polycystic ovarian syndrome: Secondary | ICD-10-CM | POA: Diagnosis not present

## 2023-04-21 DIAGNOSIS — N920 Excessive and frequent menstruation with regular cycle: Secondary | ICD-10-CM

## 2023-04-21 DIAGNOSIS — D500 Iron deficiency anemia secondary to blood loss (chronic): Secondary | ICD-10-CM

## 2023-04-21 DIAGNOSIS — R7303 Prediabetes: Secondary | ICD-10-CM

## 2023-04-21 DIAGNOSIS — Z7689 Persons encountering health services in other specified circumstances: Secondary | ICD-10-CM

## 2023-04-21 DIAGNOSIS — E6609 Other obesity due to excess calories: Secondary | ICD-10-CM

## 2023-04-21 DIAGNOSIS — Z6837 Body mass index (BMI) 37.0-37.9, adult: Secondary | ICD-10-CM

## 2023-04-21 DIAGNOSIS — E66812 Obesity, class 2: Secondary | ICD-10-CM

## 2023-04-21 NOTE — Patient Instructions (Signed)

## 2023-04-21 NOTE — Progress Notes (Addendum)
 New Patient Office Visit  Subjective   Patient ID: Kimberly Salinas, female    DOB: 1990-05-26  Age: 33 y.o. MRN: [id number]  CC:  Chief Complaint  Patient presents with   Establish Care    Pt. Here to establish care.    wegovy    The pt. Was taking Wegovy for 2 weeks for weight management and stopped due to getting headaches. Pt. Is requesting a different weight loss medication.    iron  levels    Pt. Is requesting to have iron  levels checked due to anemia.     HPI Kimberly Salinas is a 33 year old female who presents to establish with [facility] at [facility].   CC: Patient here to establish care  Last PCP: [facility], Kimberly Salinas  Specialists: none   Weight loss: started with Victoza She did Ozempic for about a year-- PCOS & pre-DM. She lost over 40lbs on Ozempic but had issues with insurance coverage.  She was switched to [facility]- was giving really bad headaches  Noticed inability to control portion size, wanting to overeat,  Exercise- gym (weight lifting with cardio) or walking 6x per week for 30 mins Healthy- grilled chicken, vegetables, healthy fats, focused on smaller potions  Had issues after second child with anxiety and does notice emotional eating   Heavy menstrual cycles: first two days of menstrual cycle  LMP: 3/16, should be getting it soon Flu-like symptoms- arms & wrist aches Iron  levels: history of IDA, has been feeling more fatigued   Outpatient Encounter Medications as of 04/21/2023  Medication Sig   ferrous sulfate  325 (65 FE) MG EC tablet Take 1 tablet (325 mg total) by mouth daily.   vitamin B-12 (CYANOCOBALAMIN) 1000 MCG tablet Take 1 tablet (1,000 mcg total) by mouth daily.   Cholecalciferol 1.25 MG (50000 UT) capsule Take by mouth. Weekly (Patient not taking: Reported on 04/21/2023)   No facility-administered encounter medications on file as of 04/21/2023.    Patient Active Problem List   Diagnosis Date Noted   BMI  34.0-34.9,adult 09/18/2022   Class 1 obesity with body mass index (BMI) of 34.0 to 34.9 in adult 09/18/2022   PCOS (polycystic ovarian syndrome) 09/18/2022   Prediabetes 09/18/2022   Acute anxiety 04/06/2019   Tremor 04/06/2019   Menorrhagia with regular cycle 09/21/2018   Iron  deficiency anemia 11/03/2017   Vitamin D  deficiency 06/11/2017   Past Medical History:  Diagnosis Date   Anemia    Past Surgical History:  Procedure Laterality Date   NO PAST SURGERIES     Family History  Problem Relation Age of Onset   Diabetes Paternal Aunt    Hypertension Paternal Aunt    Diabetes Paternal Uncle    Hypertension Paternal Uncle    Anemia Mother    Cancer Paternal Grandfather        Unknown type    Breast cancer Maternal Aunt    Anesthesia problems Neg Hx    Hypotension Neg Hx    Malignant hyperthermia Neg Hx    Pseudochol deficiency Neg Hx    Ovarian cancer Neg Hx    Colon cancer Neg Hx    Social History   Socioeconomic History   Marital status: Single    Spouse name: Not on file   Number of children: 2   Years of education: Not on file   Highest education level: Not on file  Occupational History   Not on file  Tobacco Use   Smoking status:  Never   Smokeless tobacco: Never  Vaping Use   Vaping status: Never Used  Substance and Sexual Activity   Alcohol use: No   Drug use: No   Sexual activity: Not Currently    Birth control/protection: None  Other Topics Concern   Not on file  Social History Narrative   Not on file   Social Drivers of Health   Financial Resource Strain: Low Risk  (09/18/2022)   Received from [facility]   Overall Financial Resource Strain (CARDIA)    Difficulty of Paying Living Expenses: Not very hard  Food Insecurity: No Food Insecurity (09/18/2022)   Received from [facility]   Hunger Vital Sign    Worried About Running Out of Food in the Last Year: Never true    Ran Out of Food in the Last Year: Never true  Transportation Needs: No  Transportation Needs (09/18/2022)   Received from [facility] - Transportation    Lack of Transportation (Medical): No    Lack of Transportation (Non-Medical): No  Physical Activity: Not on file  Stress: Not on file  Social Connections: Unknown (07/31/2022)   Received from [facility]   Social Network    Social Network: Not on file  Intimate Partner Violence: Unknown (07/31/2022)   Received from [facility]   HITS    Physically Hurt: Not on file    Insult or Talk Down To: Not on file    Threaten Physical Harm: Not on file    Scream or Curse: Not on file   Outpatient Medications Prior to Visit  Medication Sig Dispense Refill   ferrous sulfate  325 (65 FE) MG EC tablet Take 1 tablet (325 mg total) by mouth daily. 90 tablet 1   vitamin B-12 (CYANOCOBALAMIN) 1000 MCG tablet Take 1 tablet (1,000 mcg total) by mouth daily. 90 tablet 0   Cholecalciferol 1.25 MG (50000 UT) capsule Take by mouth. Weekly (Patient not taking: Reported on 04/21/2023)     No facility-administered medications prior to visit.   No Known Allergies   ROS: see HPI    Objective  Today's Vitals   04/21/23 1615  BP: 112/72  Pulse: 86  Temp: 98 F (36.7 C)  TempSrc: Oral  SpO2: 98%  Weight: 216 lb 12.8 oz (98.3 kg)  Height: 5\' 4"  (1.626 m)  PainSc: 0-No pain    GENERAL: Well-appearing, in NAD. Well nourished.  SKIN: Pink, warm and dry. No rash, lesion, ulceration, or ecchymoses.  Head: Normocephalic. NECK: Trachea midline. Full ROM w/o pain or tenderness. No lymphadenopathy.  EARS: Tympanic membranes are intact, translucent without bulging and without drainage. Appropriate landmarks visualized.  EYES: Conjunctiva clear without exudates. EOMI, PERRL, no drainage present.  NOSE: Septum midline w/o deformity. Nares patent, mucosa pink and non-inflamed w/o drainage. No sinus tenderness.  THROAT: Uvula midline. Oropharynx clear. Tonsils non-inflamed without exudate. Mucous membranes pink and  moist.  RESPIRATORY: Chest wall symmetrical. Respirations even and non-labored. Breath sounds clear to auscultation bilaterally.  CARDIAC: S1, S2 present, regular rate and rhythm without murmur or gallops. Peripheral pulses 2+ bilaterally.  MSK: Muscle tone and strength appropriate for age. Joints w/o tenderness, redness, or swelling.  EXTREMITIES: Without clubbing, cyanosis, or edema.  NEUROLOGIC: No motor or sensory deficits. Steady, even gait. C2-C12 intact.  PSYCH/MENTAL STATUS: Alert, oriented x 3. Cooperative, appropriate mood and affect.       Assessment & Plan:   1. Encounter to establish care (Primary) Patient is a 13- year-old female  who presents today to establish care with primary care at [facility]. Reviewed the past medical history, family history, social history, surgical history, medications and allergies today- updates made as indicated. Patient has concerns today about pharmacotherapy for weight loss and possible iron  deficiency.    2. Class 2 obesity due to excess calories without serious comorbidity with body mass index (BMI) of 37.0 to 37.9 in adult Review of previous note- patient has been on phentermine, topamax and metformin without improvement in weight. She has successfully lose 40lbs with Ozempic but had a difficult time with insurance coverage. She tried Wegovy in the past with daily headaches. She has been participating in a reduced calorie diet for greater than 6 months and at least 150 minutes of exercise without weight loss. Discussed options, with shared decision making, patient would like to start GLP-1 receptor agonist as a once weekly injection for control of diabetes. On review of medications covered, it does appear that Zepbound  should be covered by insurance. Will send in prescription after blood work returns. Discussed potential risks and side effects. Educated patient regarding medication side effects, rotating injection site, demonstrated how to utilize  injectable pen, and advised patient to inform us  if he presents with any questions or concerns. Discussed that if she is tolerating this well, we can look to gradually increase the dose as soon as 4 weeks.    3. Prediabetes Recent A1c of 5.8% completed on 09/2022. Plan to repeat hemoglobin A1c.  - Hemoglobin A1c  4. Iron  deficiency anemia due to chronic blood loss Patient reports feeling more fatigued, history of IDA,, and menorrhagia on the initial couple of days . Denies being on her current menstrual cycle. Vital signs are without acute concerns. Will update patient with results. She is not curently taking OTC ferrous sulfate , Will assess CBC and iron  studies.  - CBC with Differential/Platelet - Iron , TIBC and Ferritin Panel  5. Menorrhagia with regular cycle See #4 - CBC with Differential/Platelet - Iron , TIBC and Ferritin Panel  6. PCOS (polycystic ovarian syndrome) See #2   No follow-ups on file.   Wilhelmena Hanson, FNP

## 2023-04-22 LAB — HEMOGLOBIN A1C
Est. average glucose Bld gHb Est-mCnc: 126 mg/dL
Hgb A1c MFr Bld: 6 % — ABNORMAL HIGH (ref 4.8–5.6)

## 2023-04-22 LAB — CBC WITH DIFFERENTIAL/PLATELET
Basophils Absolute: 0 10*3/uL (ref 0.0–0.2)
Basos: 0 %
EOS (ABSOLUTE): 0.3 10*3/uL (ref 0.0–0.4)
Eos: 3 %
Hematocrit: 31 % — ABNORMAL LOW (ref 34.0–46.6)
Hemoglobin: 10.1 g/dL — ABNORMAL LOW (ref 11.1–15.9)
Immature Grans (Abs): 0 10*3/uL (ref 0.0–0.1)
Immature Granulocytes: 1 %
Lymphocytes Absolute: 2.5 10*3/uL (ref 0.7–3.1)
Lymphs: 31 %
MCH: 26 pg — ABNORMAL LOW (ref 26.6–33.0)
MCHC: 32.6 g/dL (ref 31.5–35.7)
MCV: 80 fL (ref 79–97)
Monocytes Absolute: 0.6 10*3/uL (ref 0.1–0.9)
Monocytes: 8 %
Neutrophils Absolute: 4.5 10*3/uL (ref 1.4–7.0)
Neutrophils: 57 %
Platelets: 349 10*3/uL (ref 150–450)
RBC: 3.89 x10E6/uL (ref 3.77–5.28)
RDW: 13.1 % (ref 11.7–15.4)
WBC: 8 10*3/uL (ref 3.4–10.8)

## 2023-04-22 LAB — IRON,TIBC AND FERRITIN PANEL
Ferritin: 9 ng/mL — ABNORMAL LOW (ref 15–150)
Iron Saturation: 8 % — CL (ref 15–55)
Iron: 36 ug/dL (ref 27–159)
Total Iron Binding Capacity: 447 ug/dL (ref 250–450)
UIBC: 411 ug/dL (ref 131–425)

## 2023-04-23 ENCOUNTER — Telehealth: Payer: Self-pay | Admitting: Family Medicine

## 2023-04-23 NOTE — Telephone Encounter (Signed)
 Copied from CRM [id number]. Topic: Clinical - Lab/Test Results >> Apr 23, 2023 12:24 PM Kimberly Salinas wrote: Reason for CRM: Patient called requesting a call back regarding lab results.

## 2023-04-24 ENCOUNTER — Other Ambulatory Visit: Payer: Self-pay | Admitting: Family Medicine

## 2023-04-24 ENCOUNTER — Encounter: Payer: Self-pay | Admitting: Family Medicine

## 2023-04-24 DIAGNOSIS — E66812 Obesity, class 2: Secondary | ICD-10-CM

## 2023-04-24 DIAGNOSIS — E282 Polycystic ovarian syndrome: Secondary | ICD-10-CM

## 2023-04-24 DIAGNOSIS — R7303 Prediabetes: Secondary | ICD-10-CM

## 2023-04-24 MED ORDER — IRON (FERROUS SULFATE) 325 (65 FE) MG PO TABS: 325.0000 mg | ORAL_TABLET | Freq: Every day | ORAL | 2 refills | Status: DC

## 2023-04-24 MED ORDER — ZEPBOUND 2.5 MG/0.5ML ~~LOC~~ SOAJ
2.5000 mg | SUBCUTANEOUS | 2 refills | Status: DC
Start: 2023-04-24 — End: 2023-05-11

## 2023-05-02 ENCOUNTER — Emergency Department
Admission: EM | Admit: 2023-05-02 | Discharge: 2023-05-02 | Disposition: A | Discharge status: Home / Self Care | Attending: Emergency Medicine | Admitting: Emergency Medicine

## 2023-05-02 ENCOUNTER — Other Ambulatory Visit: Payer: Self-pay

## 2023-05-02 ENCOUNTER — Emergency Department

## 2023-05-02 DIAGNOSIS — J069 Acute upper respiratory infection, unspecified: Secondary | ICD-10-CM | POA: Insufficient documentation

## 2023-05-02 DIAGNOSIS — R059 Cough, unspecified: Secondary | ICD-10-CM | POA: Diagnosis present

## 2023-05-02 LAB — RESP PANEL BY RT-PCR (RSV, FLU A&B, COVID)  RVPGX2
Influenza A by PCR: NEGATIVE
Influenza B by PCR: NEGATIVE
Resp Syncytial Virus by PCR: NEGATIVE
SARS Coronavirus 2 by RT PCR: NEGATIVE

## 2023-05-02 LAB — GROUP A STREP BY PCR: Group A Strep by PCR: NOT DETECTED

## 2023-05-02 NOTE — ED Provider Notes (Signed)
 [facility] Provider Note    Event Date/Time   First MD Initiated Contact with Patient 05/02/23 2254     (approximate)   History   Cough   HPI Kimberly Salinas is a 33 y.o. female who is otherwise healthy and presents for evaluation of several days of nasal congestion, mild sore throat, and cough with some associated chest discomfort in the middle of her chest when she coughs.  Sometimes she hears a little bit of crackling in her breathing as well.  She said that all of her kids have gone through the same thing and now it is her term.  Apparently they all sought outpatient provider who told him that they have an upper respiratory infection.  However because her chest is hurting when she coughs she got worried and wanted to be checked out again.  She has not been taking any medication.  She is not having any difficulty breathing, just sometimes hears some crackling sounds and has some discomfort when she coughs or takes deep breaths.     Physical Exam   Triage Vital Signs: ED Triage Vitals  Encounter Vitals Group     BP 05/02/23 2043 120/74     Systolic BP Percentile --      Diastolic BP Percentile --      Pulse Rate 05/02/23 2043 87     Resp 05/02/23 2043 17     Temp 05/02/23 2043 98.4 F (36.9 C)     Temp Source 05/02/23 2043 Oral     SpO2 05/02/23 2043 100 %     Weight 05/02/23 2044 98 kg (216 lb)     Height 05/02/23 2044 1.626 m (5\' 4" )     Head Circumference --      Peak Flow --      Pain Score 05/02/23 2044 0     Pain Loc --      Pain Education --      Exclude from Growth Chart --     Most recent vital signs: Vitals:   05/02/23 2043  BP: 120/74  Pulse: 87  Resp: 17  Temp: 98.4 F (36.9 C)  SpO2: 100%    General: Awake, no distress.  CV:  Good peripheral perfusion. Regular rate/rhythm Resp:  Normal effort. Speaking easily and comfortably, no accessory muscle usage nor intercostal retractions.  Lungs are clear to auscultation  bilaterally.  No referred upper airway noise. Abd:  No distention.    ED Results / Procedures / Treatments   Labs (all labs ordered are listed, but only abnormal results are displayed) Labs Reviewed  GROUP A STREP BY PCR  RESP PANEL BY RT-PCR (RSV, FLU A&B, COVID)  [id number]     RADIOLOGY I viewed and interpreted the patient's two-view chest x-ray and I see no evidence of pneumonia or viral pattern.  I also read the radiologist's report, which confirmed no acute findings.   PROCEDURES:  Critical Care performed: No  Procedures    IMPRESSION / MDM / ASSESSMENT AND PLAN / ED COURSE  I reviewed the triage vital signs and the nursing notes.                              Differential diagnosis includes, but is not limited to, viral illness, community-acquired pneumonia, costochondritis, musculoskeletal strain of chest wall, less likely PE.  Patient's presentation is most consistent with acute complicated illness / injury requiring diagnostic workup.  Labs/studies ordered: Respiratory viral panel, group A strep PCR, two-view chest x-ray  Interventions/Medications given:  Medications - No data to display  (Note:  hospital course my include additional interventions and/or labs/studies not listed above.)   Patient has normal vital signs, very reassuring physical exam with normal lung sounds.  Chest x-ray clear, viral panel testing is negative.  PERC negative.  I provided reassurance and encouraged use of ibuprofen  and/or Tylenol  and outpatient follow-up as needed.  The patient's medical screening exam is reassuring with no indication of an emergent medical condition requiring hospitalization or additional evaluation at this point.  The patient is safe and appropriate for discharge and outpatient follow up.       FINAL CLINICAL IMPRESSION(S) / ED DIAGNOSES   Final diagnoses:  Viral URI with cough     Rx / DC Orders   ED Discharge Orders     None        Note:  This  document was prepared using Dragon voice recognition software and may include unintentional dictation errors.   Lynnda Sas, MD 05/02/23 2350

## 2023-05-02 NOTE — Discharge Instructions (Signed)
 You have been seen in the Emergency Department (ED) today for a likely viral illness.  Please drink plenty of clear fluids (water, Gatorade, chicken broth, etc).  You may use Tylenol and/or Motrin according to label instructions.  You can alternate between the two without any side effects.   Please follow up with your doctor as listed above.  Call your doctor or return to the Emergency Department (ED) if you are unable to tolerate fluids due to vomiting, have worsening trouble breathing, become extremely tired or difficult to awaken, or if you develop any other symptoms that concern you.

## 2023-05-02 NOTE — ED Triage Notes (Signed)
 Pt in via POV from home for cough and chest soreness when coughing, sore throat, body aches, wheezing, and fatigue x 2 days

## 2023-05-05 ENCOUNTER — Ambulatory Visit: Admitting: Family Medicine

## 2023-05-11 ENCOUNTER — Ambulatory Visit (INDEPENDENT_AMBULATORY_CARE_PROVIDER_SITE_OTHER): Admitting: Family Medicine

## 2023-05-11 ENCOUNTER — Encounter: Payer: Self-pay | Admitting: Family Medicine

## 2023-05-11 ENCOUNTER — Telehealth: Payer: Self-pay

## 2023-05-11 VITALS — BP 109/73 | HR 67 | Resp 18 | Ht 64.0 in | Wt 217.0 lb

## 2023-05-11 DIAGNOSIS — E6609 Other obesity due to excess calories: Secondary | ICD-10-CM

## 2023-05-11 DIAGNOSIS — E66812 Obesity, class 2: Secondary | ICD-10-CM

## 2023-05-11 DIAGNOSIS — F419 Anxiety disorder, unspecified: Secondary | ICD-10-CM

## 2023-05-11 DIAGNOSIS — Z6837 Body mass index (BMI) 37.0-37.9, adult: Secondary | ICD-10-CM

## 2023-05-11 DIAGNOSIS — E282 Polycystic ovarian syndrome: Secondary | ICD-10-CM

## 2023-05-11 DIAGNOSIS — R7303 Prediabetes: Secondary | ICD-10-CM | POA: Diagnosis not present

## 2023-05-11 DIAGNOSIS — D509 Iron deficiency anemia, unspecified: Secondary | ICD-10-CM

## 2023-05-11 MED ORDER — ZEPBOUND 2.5 MG/0.5ML ~~LOC~~ SOAJ: 2.5000 mg | SUBCUTANEOUS | 2 refills | Status: DC

## 2023-05-11 MED ORDER — IRON (FERROUS SULFATE) 325 (65 FE) MG PO TABS: 325.0000 mg | ORAL_TABLET | Freq: Every day | ORAL | 2 refills | Status: DC

## 2023-05-11 NOTE — Patient Instructions (Addendum)
 Below are some facilities that offer counseling services.  Please call to arrange an appointment with a counselor:                                                                    Behavioral Health/Counseling Services:  [facility]  Phone: [phone] [address] [place], [place] [address]  [facility] Phone: [phone] Various locations   [facility] Outpatient Office  Phone: [phone] [address] [place], [place] [address]  [facility]) Phone: [phone]  [facility] Phone: [phone] [address], [address] [place], [place] [address]   Mental Health- Accepts Medicaid  (* = Spanish available;  + = Psychiatric services) * [facility]                            [phone]  Walk in 9am-1pm Virtual & Onsite  *+ [facility]:                                     [phone] or [phone] Virtual & Onsite  [facility]:                                              [phone] Virtual & Onsite  + [facility]:                                         [phone] Virtual & Onsite  * [facility]:                                                   [phone]   [facility]                                                    [phone] Virtual & Onsite  [facility]           [phone] Virtual   [facility]:                                                           [phone] Virtual & Onsite  * [facility]:  [phone] Virtual & Onsite  [facility]:                            [phone]   *[facility]                                                [phone] Virtual & Onsite  + [facility]:             [phone] or [phone]   [name]                                                 [phone] Virtual & Onsite   _______________________________________________________________________________________   Kimberly Salinas can also visit PsychologyToday.com to find a counselor that is within your insurance's network.  Website: https://www.psychologytoday.com/us /therapists   If you need immediate emotional support, reach out to the national mental health hotline: 988. Whether you're facing mental health struggles, emotional distress, alcohol or drug use concerns, or just need someone to talk to, our caring counselors are here for you. You are not alone.  _______________________________________________________________________________________                                                                                                                                                                                                                                                                                                       For immediate mental health services open 24 hours and no referral needed: [facility], [address] [place], [place] [address], [phone] [facility], [address], [place], [place] [address], [phone] [facility], [address], [place], [place] [address], [phone] Any emergency room or 911  [facility] [place], [phone]

## 2023-05-11 NOTE — Progress Notes (Signed)
 Acute Care Office Visit  Subjective:   Kimberly Salinas 07/30/1990 05/11/2023  Chief Complaint  Patient presents with   Medical Management of Chronic Issues    Pt is still having problems with worrying a lot. Does want to discuss a referral for [facility].   MOOD: Kimberly Salinas is a 33 year old female patient who presents for the medical management of anxiety & depression.  She did not want to try pharmacotherapy- told her previous PCP about anxiety and increased worrying.  Denies insomnia. Denies difficulty falling asleep.  Reports waking up frequently throughout the night- 3-4x per night.  Previous medication regimen: none Counseling: no, but would like to try  Denies SI/HI.      05/11/2023   10:12 AM 04/21/2023    4:10 PM  GAD 7 : Generalized Anxiety Score  Nervous, Anxious, on Edge 0 0  Control/stop worrying 3 1  Worry too much - different things 3 1  Trouble relaxing 3 1  Restless 1 1  Easily annoyed or irritable 2 1  Afraid - awful might happen 1 1  Total GAD 7 Score 13 6  Anxiety Difficulty Not difficult at all Not difficult at all      05/11/2023   10:11 AM 04/21/2023    4:10 PM  PHQ9 SCORE ONLY  PHQ-9 Total Score 7 10   The following portions of the patient's history were reviewed and updated as appropriate: past medical history, past surgical history, family history, social history, allergies, medications, and problem list.   Patient Active Problem List   Diagnosis Date Noted   BMI 34.0-34.9,adult 09/18/2022   Class 1 obesity with body mass index (BMI) of 34.0 to 34.9 in adult 09/18/2022   PCOS (polycystic ovarian syndrome) 09/18/2022   Prediabetes 09/18/2022   Acute anxiety 04/06/2019   Tremor 04/06/2019   Menorrhagia with regular cycle 09/21/2018   Iron  deficiency anemia 11/03/2017   Vitamin D  deficiency 06/11/2017   Past Medical History:  Diagnosis Date   Anemia    Past Surgical History:  Procedure Laterality Date   NO PAST  SURGERIES     Family History  Problem Relation Age of Onset   Diabetes Paternal Aunt    Hypertension Paternal Aunt    Diabetes Paternal Uncle    Hypertension Paternal Uncle    Anemia Mother    Cancer Paternal Grandfather        Unknown type    Breast cancer Maternal Aunt    Anesthesia problems Neg Hx    Hypotension Neg Hx    Malignant hyperthermia Neg Hx    Pseudochol deficiency Neg Hx    Ovarian cancer Neg Hx    Colon cancer Neg Hx    Outpatient Medications Prior to Visit  Medication Sig Dispense Refill   Iron , Ferrous Sulfate , 325 (65 Fe) MG TABS Take 325 mg by mouth daily. (Patient not taking: Reported on 05/11/2023) 30 tablet 2   tirzepatide  (ZEPBOUND ) 2.5 MG/0.5ML Pen Inject 2.5 mg into the skin once a week. (Patient not taking: Reported on 05/11/2023) 2 mL 2   vitamin B-12 (CYANOCOBALAMIN) 1000 MCG tablet Take 1 tablet (1,000 mcg total) by mouth daily. (Patient not taking: Reported on 05/11/2023) 90 tablet 0   No facility-administered medications prior to visit.   No Known Allergies  ROS: A complete ROS was performed with pertinent positives/negatives noted in the HPI. The remainder of the ROS are negative.    Objective:   Today's Vitals   05/11/23  1008  BP: 109/73  Pulse: 67  Resp: 18  Weight: 217 lb (98.4 kg)  Height: 5\' 4"  (1.626 m)    GENERAL: Well-appearing, in NAD. Well nourished.  SKIN: Pink, warm and dry. No rash, lesion, ulceration, or ecchymoses.  Head: Normocephalic. NECK: Trachea midline. Full ROM w/o pain or tenderness. No lymphadenopathy.  EARS: Tympanic membranes are intact, translucent without bulging and without drainage. Appropriate landmarks visualized.  EYES: Conjunctiva clear without exudates. EOMI, PERRL, no drainage present.  NOSE: Septum midline w/o deformity. Nares patent, mucosa pink and non-inflamed w/o drainage. No sinus tenderness.  THROAT: Uvula midline. Oropharynx clear. Tonsils non-inflamed without exudate. Mucous membranes pink and  moist.  RESPIRATORY: Chest wall symmetrical. Respirations even and non-labored. Breath sounds clear to auscultation bilaterally.  CARDIAC: S1, S2 present, regular rate and rhythm without murmur or gallops. Peripheral pulses 2+ bilaterally.  MSK: Muscle tone and strength appropriate for age. Joints w/o tenderness, redness, or swelling.  EXTREMITIES: Without clubbing, cyanosis, or edema.  NEUROLOGIC: No motor or sensory deficits. Steady, even gait. C2-C12 intact.  PSYCH/MENTAL STATUS: Alert, oriented x 3. Cooperative, appropriate mood and affect.     Assessment & Plan:   1. Anxiety (Primary) GAD7 completed with score of 13. PHQ9 (for depression) completed with score of 7. Denies active or passive suicidal ideations.  Denies issues with panic attacks, shortness of breath, difficulty breathing, palpitations, hyperventilation, and dizziness. Reports uncontrollable worrying. Discussed benefits of cognitive behavioral therapy (CBT) and first-line pharmacotherapy concurrently. Patient is interested in counseling and would not like to try medication at this time. Referral placed for counseling services and patient given list of places that accept her insurance.  - Ambulatory referral to Psychology  2. Class 2 obesity due to excess calories without serious comorbidity with body mass index (BMI) of 37.0 to 37.9 in adult BMI 37.25 today. Previous OV on 4/8- patient was prescribed Zepbound - prior authorization was initiated. Per previous note "patient has been on phentermine, topamax and metformin without improvement in weight. She has successfully lose 40lbs with Ozempic but had a difficult time with insurance coverage. She tried Wegovy in the past with daily headaches. She has been participating in a reduced calorie diet for greater than 6 months and at least 150 minutes of exercise without weight loss." Rx sent to different pharmacy and will update patient with insurance decision. Discussed that if she is  tolerating this well, we can look to gradually increase the dose as soon as 4 weeks.  - tirzepatide  (ZEPBOUND ) 2.5 MG/0.5ML Pen; Inject 2.5 mg into the skin once a week.  Dispense: 2 mL; Refill: 2  3. PCOS (polycystic ovarian syndrome) See #2 - tirzepatide  (ZEPBOUND ) 2.5 MG/0.5ML Pen; Inject 2.5 mg into the skin once a week.  Dispense: 2 mL; Refill: 2  4. Prediabetes See #2 - tirzepatide  (ZEPBOUND ) 2.5 MG/0.5ML Pen; Inject 2.5 mg into the skin once a week.  Dispense: 2 mL; Refill: 2  5. Iron  deficiency anemia, unspecified iron  deficiency anemia type Rx sent to different pharmacy on file.  - Iron , Ferrous Sulfate , 325 (65 Fe) MG TABS; Take 325 mg by mouth daily.  Dispense: 30 tablet; Refill: 2   Meds ordered this encounter  Medications   tirzepatide  (ZEPBOUND ) 2.5 MG/0.5ML Pen    Sig: Inject 2.5 mg into the skin once a week.    Dispense:  2 mL    Refill:  2    Supervising Provider:   ZIGLAR, SUSAN K [[id number]]   Iron , Ferrous Sulfate , 325 (  65 Fe) MG TABS    Sig: Take 325 mg by mouth daily.    Dispense:  30 tablet    Refill:  2    Supervising Provider:   ZIGLAR, SUSAN K [[id number]]   Lab Orders  No laboratory test(s) ordered today    Return in about 6 weeks (around 06/22/2023) for weight loss medication f/u.    Patient to reach out to office if new, worrisome, or unresolved symptoms arise or if no improvement in patient's condition. Patient verbalized understanding and is agreeable to treatment plan. All questions answered to patient's satisfaction.    Wilhelmena Hanson, FNP

## 2023-05-11 NOTE — Telephone Encounter (Signed)
 Message from Plan   This drug has been approved.  Approved quantity: 2 milliliters per 28 day(s).  You may fill up to a 34 day supply at a retail pharmacy.  You may fill up to a 90 day supply for maintenance drugs, please refer to the formulary for details.  Please call the pharmacy to process your prescription claim.. Authorization Expiration Date: November 07, 2023.

## 2023-05-11 NOTE — Telephone Encounter (Signed)
 Per fax from [facility], Kimberly Salinas is the preferred formulary.

## 2023-05-11 NOTE — Telephone Encounter (Signed)
 Key[facility] Aland)  Rx #: [id number]  Zepbound  2.5MG /0.5ML pen-injectors  Form  [employer] of   Electronic Prior Authorization Request Form [phone] NCPDP)

## 2023-06-22 ENCOUNTER — Telehealth: Payer: Self-pay

## 2023-06-22 ENCOUNTER — Encounter: Payer: Self-pay | Admitting: Family Medicine

## 2023-06-22 ENCOUNTER — Ambulatory Visit (INDEPENDENT_AMBULATORY_CARE_PROVIDER_SITE_OTHER): Admitting: Family Medicine

## 2023-06-22 VITALS — BP 105/71 | HR 73 | Temp 98.1°F | Resp 18 | Ht 64.0 in | Wt 211.0 lb

## 2023-06-22 DIAGNOSIS — E66812 Obesity, class 2: Secondary | ICD-10-CM | POA: Diagnosis not present

## 2023-06-22 DIAGNOSIS — Z6836 Body mass index (BMI) 36.0-36.9, adult: Secondary | ICD-10-CM | POA: Diagnosis not present

## 2023-06-22 DIAGNOSIS — E282 Polycystic ovarian syndrome: Secondary | ICD-10-CM

## 2023-06-22 DIAGNOSIS — R7303 Prediabetes: Secondary | ICD-10-CM | POA: Diagnosis not present

## 2023-06-22 DIAGNOSIS — E6609 Other obesity due to excess calories: Secondary | ICD-10-CM | POA: Diagnosis not present

## 2023-06-22 MED ORDER — TIRZEPATIDE-WEIGHT MANAGEMENT 5 MG/0.5ML ~~LOC~~ SOAJ
5.0000 mg | SUBCUTANEOUS | 0 refills | Status: DC
Start: 2023-06-22 — End: 2023-08-17

## 2023-06-22 MED ORDER — ONDANSETRON 4 MG PO TBDP
4.0000 mg | ORAL_TABLET | Freq: Three times a day (TID) | ORAL | 0 refills | Status: DC | PRN
Start: 2023-06-22 — End: 2023-08-17

## 2023-06-22 MED ORDER — KETOCONAZOLE 2 % EX SHAM: 1.0000 | MEDICATED_SHAMPOO | CUTANEOUS | 0 refills | Status: DC

## 2023-06-22 NOTE — Telephone Encounter (Signed)
 Message from Plan  Approved.  This drug has been approved.   Approved quantity: 2 milliliters per 28 day(s). You may fill up to a 34 day supply at a retail pharmacy. You may fill up to a 90 day supply for maintenance drugs.    Authorization Expiration Date: December 19, 2023.

## 2023-06-22 NOTE — Progress Notes (Signed)
   Established Patient Office Visit  Subjective  Patient ID: Kimberly Salinas, female    DOB: 1990/05/10  Age: 33 y.o. MRN: [id number]  Chief Complaint  Patient presents with   Medical Management of Chronic Issues   WEIGHT LOSS: 05/11/2023: BMI 37.25  Today: 36.22 (6 lbs lost since last visit) Zepbound  2.5mg  weekly currently  First two weeks, she was really sick- very nauseous.  She started to get headaches but thinks this was related to not eating enough.  She reports her appetite and cravings are returning.   ROS: see HPI     Objective:      BP 105/71 (BP Location: Right Arm, Patient Position: Sitting, Cuff Size: Normal)   Pulse 73   Temp 98.1 F (36.7 C) (Oral)   Resp 18   Ht 5\' 4"  (1.626 m)   Wt 211 lb (95.7 kg)   LMP 05/29/2023 (Approximate)   SpO2 98%   BMI 36.22 kg/m  BP Readings from Last 3 Encounters:  06/22/23 105/71  05/11/23 109/73  05/02/23 104/64     Physical Exam Vitals reviewed.  Constitutional:      Appearance: Normal appearance.  Cardiovascular:     Rate and Rhythm: Normal rate and regular rhythm.     Pulses: Normal pulses.     Heart sounds: Normal heart sounds.  Pulmonary:     Effort: Pulmonary effort is normal.     Breath sounds: Normal breath sounds.  Neurological:     Mental Status: She is alert.  Psychiatric:        Mood and Affect: Mood normal.        Behavior: Behavior normal.       Assessment & Plan:   1. Class 2 obesity due to excess calories without serious comorbidity with body mass index (BMI) of 36.0 to 36.9 in adult (Primary) Patient has been doing well on Zepbound  2.5mg  weekly and would like to increase the dose to 5mg  weekly. Denies adverse side effects. Patient has lost 6 lbs since starting this medication. She has been focusing on healthy lifestyle modifications- along with diet and exercise. Encouraged her to eat small, frequent meals, stay hydrated, and continue on daily exercise. Discussed that if she is  tolerating this well, we can look to gradually increase the dose as soon as 4 weeks.   - tirzepatide  (ZEPBOUND ) 5 MG/0.5ML Pen; Inject 5 mg into the skin once a week.  Dispense: 2 mL; Refill: 0 - ondansetron  (ZOFRAN -ODT) 4 MG disintegrating tablet; Take 1 tablet (4 mg total) by mouth every 8 (eight) hours as needed for nausea or vomiting.  Dispense: 20 tablet; Refill: 0  2. PCOS (polycystic ovarian syndrome) See #1 - tirzepatide  (ZEPBOUND ) 5 MG/0.5ML Pen; Inject 5 mg into the skin once a week.  Dispense: 2 mL; Refill: 0  3. Prediabetes See #1 - tirzepatide  (ZEPBOUND ) 5 MG/0.5ML Pen; Inject 5 mg into the skin once a week.  Dispense: 2 mL; Refill: 0   Return in about 4 weeks (around 07/20/2023) for weight loss .    Wilhelmena Hanson, FNP

## 2023-06-22 NOTE — Patient Instructions (Addendum)
 [facility] Phone: [phone] Various locations-- let them know you want to schedule at the [facility] location

## 2023-06-22 NOTE — Telephone Encounter (Signed)
(  Key: [id number])  Zepbound  5MG /0.5ML pen-injectors  Form [employer] of [place]  Electronic Prior Authorization Request Form [id number] NCPDP)

## 2023-06-26 ENCOUNTER — Ambulatory Visit: Admitting: Family Medicine

## 2023-08-03 ENCOUNTER — Ambulatory Visit: Admitting: Family Medicine

## 2023-08-03 ENCOUNTER — Encounter: Payer: Self-pay | Admitting: Family Medicine

## 2023-08-03 ENCOUNTER — Encounter: Payer: Self-pay | Admitting: Oncology

## 2023-08-04 ENCOUNTER — Ambulatory Visit: Admitting: Family Medicine

## 2023-08-17 ENCOUNTER — Ambulatory Visit (INDEPENDENT_AMBULATORY_CARE_PROVIDER_SITE_OTHER): Admitting: Family Medicine

## 2023-08-17 VITALS — BP 110/75 | HR 62 | Resp 16 | Ht 64.0 in | Wt 218.0 lb

## 2023-08-17 DIAGNOSIS — E66812 Obesity, class 2: Secondary | ICD-10-CM

## 2023-08-17 DIAGNOSIS — R7303 Prediabetes: Secondary | ICD-10-CM

## 2023-08-17 DIAGNOSIS — Z6836 Body mass index (BMI) 36.0-36.9, adult: Secondary | ICD-10-CM

## 2023-08-17 DIAGNOSIS — R5383 Other fatigue: Secondary | ICD-10-CM

## 2023-08-17 DIAGNOSIS — E6609 Other obesity due to excess calories: Secondary | ICD-10-CM | POA: Diagnosis not present

## 2023-08-17 DIAGNOSIS — E282 Polycystic ovarian syndrome: Secondary | ICD-10-CM

## 2023-08-17 DIAGNOSIS — D509 Iron deficiency anemia, unspecified: Secondary | ICD-10-CM

## 2023-08-17 MED ORDER — TIRZEPATIDE-WEIGHT MANAGEMENT 2.5 MG/0.5ML ~~LOC~~ SOLN: 2.5000 mg | SUBCUTANEOUS | 0 refills | Status: DC

## 2023-08-17 NOTE — Progress Notes (Unsigned)
   Established Patient Office Visit  Subjective  Patient ID: Kimberly Salinas, female    DOB: 1990/02/06  Age: 33 y.o. MRN: [id number]  No chief complaint on file.  She felt decent on the 2.5mg  weekly- she was getting her appetite  Zepbound : 5mg  weekly  History of IDA  ROS: see HPI     Objective:     BP 110/75   Pulse 62   Resp 16   Ht 5' 4 (1.626 m)   Wt 218 lb (98.9 kg)   LMP 07/25/2023 (Exact Date)   SpO2 98%   BMI 37.42 kg/m  BP Readings from Last 3 Encounters:  08/17/23 110/75  06/22/23 105/71  05/11/23 109/73     Physical Exam    Assessment & Plan:  Iron  deficiency anemia, unspecified iron  deficiency anemia type -     CBC with Differential/Platelet -     Iron , TIBC and Ferritin Panel  Fatigue, unspecified type -     Vitamin B12 -     CBC with Differential/Platelet -     Iron , TIBC and Ferritin Panel  Class 2 obesity due to excess calories without serious comorbidity with body mass index (BMI) of 36.0 to 36.9 in adult -     Tirzepatide -Weight Management; Inject 2.5 mg into the skin once a week.  Dispense: 2 mL; Refill: 0  PCOS (polycystic ovarian syndrome)  Prediabetes     Return in about 4 weeks (around 09/14/2023) for Physical (& PAP) with fasting labs.    Evalene Arts, FNP

## 2023-08-17 NOTE — Patient Instructions (Addendum)
 [facility] Phone: [phone]      MyChart:  For all urgent or time sensitive needs we ask that you please call the office to avoid delays. Our number is [phone]) [phone]. MyChart is not constantly monitored and due to the large volume of messages a day, replies may take up to 72 business hours.   MyChart Policy: MyChart allows for you to see your visit notes, after visit summary, provider recommendations, lab and tests results, make an appointment, request refills, and contact your provider or the office for non-urgent questions or concerns. Providers are seeing patients during normal business hours and do not have built in time to review MyChart messages.  We ask that you allow a minimum of 3 business days for responses to [employer]. For this reason, please do not send urgent requests through MyChart. Please call the office at [phone]. New and ongoing conditions may require a visit. We have virtual and in person visit available for your convenience.  Complex MyChart concerns may require a visit. Your provider may request you schedule a virtual or in person visit to ensure we are providing the best care possible. MyChart messages sent after 11:00 AM on Friday will not be received by the provider until Monday morning.    Lab and Test Results: You will receive your lab and test results on MyChart as soon as they are completed and results have been sent by the lab or testing facility. Due to this service, you will receive your results BEFORE your provider.  I review lab and tests results each morning prior to seeing patients. Some results require collaboration with other providers to ensure you are receiving the most appropriate care. For this reason, we ask that you please allow a minimum of 3-5 business days from the time the ALL results have been received for your provider to receive and review lab and test results and contact you about these.  Most lab and test  result comments from the provider will be sent through MyChart. Your provider may recommend changes to the plan of care, follow-up visits, repeat testing, ask questions, or request an office visit to discuss these results. You may reply directly to this message or call the office at [phone] to provide information for the provider or set up an appointment. In some instances, you will be called with test results and recommendations. Please let us  know if this is preferred and we will make note of this in your chart to provide this for you.    If you have not heard a response to your lab or test results in 5 business days from all results returning to MyChart, please call the office to let us  know. We ask that you please avoid calling prior to this time unless there is an emergent concern. Due to high call volumes, this can delay the resulting process.   After Hours: For all non-emergency after hours needs, please call the office at [phone] and select the option to reach the on-call provider service. On-call services are shared between multiple [facility] offices and therefore it will not be possible to speak directly with your provider. On-call providers may provide medical advice and recommendations, but are unable to provide refills for maintenance medications.  For all emergency or urgent medical needs after normal business hours, we recommend that you seek care at the closest Urgent Care or Emergency Department to ensure appropriate treatment in a timely manner.  [facility] [place] has a  24 hour emergency room located on the ground floor for your convenience.    Urgent Concerns During the Business Day Providers are seeing patients from 8AM to 5PM, Monday through Thursday, and 8AM to 12PM on Friday with a busy schedule and are most often not able to respond to non-urgent calls until the end of the day or the next business day. If you should have URGENT concerns during the day,  please call and speak to the nurse or schedule a same day appointment so that we can address your concern without delay.    Thank you, again, for choosing me as your health care partner. I appreciate your trust and look forward to learning more about you.    Evalene Arts, FNP-C

## 2023-08-18 ENCOUNTER — Ambulatory Visit: Payer: Self-pay | Admitting: Family Medicine

## 2023-08-18 LAB — IRON,TIBC AND FERRITIN PANEL
Ferritin: 9 ng/mL — ABNORMAL LOW (ref 15–150)
Iron Saturation: 10 % — ABNORMAL LOW (ref 15–55)
Iron: 41 ug/dL (ref 27–159)
Total Iron Binding Capacity: 426 ug/dL (ref 250–450)
UIBC: 385 ug/dL (ref 131–425)

## 2023-08-18 LAB — CBC WITH DIFFERENTIAL/PLATELET
Basophils Absolute: 0 x10E3/uL (ref 0.0–0.2)
Basos: 0 %
EOS (ABSOLUTE): 0.2 x10E3/uL (ref 0.0–0.4)
Eos: 3 %
Hematocrit: 34.1 % (ref 34.0–46.6)
Hemoglobin: 11 g/dL — ABNORMAL LOW (ref 11.1–15.9)
Immature Grans (Abs): 0 x10E3/uL (ref 0.0–0.1)
Immature Granulocytes: 0 %
Lymphocytes Absolute: 2.2 x10E3/uL (ref 0.7–3.1)
Lymphs: 32 %
MCH: 26 pg — ABNORMAL LOW (ref 26.6–33.0)
MCHC: 32.3 g/dL (ref 31.5–35.7)
MCV: 81 fL (ref 79–97)
Monocytes Absolute: 0.4 x10E3/uL (ref 0.1–0.9)
Monocytes: 6 %
Neutrophils Absolute: 4 x10E3/uL (ref 1.4–7.0)
Neutrophils: 59 %
Platelets: 319 x10E3/uL (ref 150–450)
RBC: 4.23 x10E6/uL (ref 3.77–5.28)
RDW: 15.4 % (ref 11.7–15.4)
WBC: 6.9 x10E3/uL (ref 3.4–10.8)

## 2023-08-18 LAB — VITAMIN B12: Vitamin B-12: 681 pg/mL (ref 232–1245)

## 2023-09-07 ENCOUNTER — Ambulatory Visit

## 2023-09-07 ENCOUNTER — Ambulatory Visit: Admitting: Family Medicine

## 2023-09-07 DIAGNOSIS — E6609 Other obesity due to excess calories: Secondary | ICD-10-CM

## 2023-09-07 DIAGNOSIS — R7303 Prediabetes: Secondary | ICD-10-CM

## 2023-09-07 DIAGNOSIS — E559 Vitamin D deficiency, unspecified: Secondary | ICD-10-CM

## 2023-09-07 DIAGNOSIS — D500 Iron deficiency anemia secondary to blood loss (chronic): Secondary | ICD-10-CM

## 2023-09-08 LAB — LIPID PANEL
Chol/HDL Ratio: 3.7 ratio (ref 0.0–4.4)
Cholesterol, Total: 150 mg/dL (ref 100–199)
HDL: 41 mg/dL (ref >39)
LDL Chol Calc (NIH): 88 mg/dL (ref 0–99)
Triglycerides: 115 mg/dL (ref 0–149)
VLDL Cholesterol Cal: 21 mg/dL (ref 5–40)

## 2023-09-08 LAB — HEMOGLOBIN A1C
Est. average glucose Bld gHb Est-mCnc: 117 mg/dL
Hgb A1c MFr Bld: 5.7 % — ABNORMAL HIGH (ref 4.8–5.6)

## 2023-09-08 LAB — TSH: TSH: 3.23 u[IU]/mL (ref 0.450–4.500)

## 2023-09-15 ENCOUNTER — Encounter: Payer: Self-pay | Admitting: Family Medicine

## 2023-09-15 ENCOUNTER — Ambulatory Visit (INDEPENDENT_AMBULATORY_CARE_PROVIDER_SITE_OTHER): Admitting: Family Medicine

## 2023-09-15 VITALS — BP 107/70 | HR 75 | Resp 16 | Ht 64.0 in | Wt 216.0 lb

## 2023-09-15 DIAGNOSIS — Z0000 Encounter for general adult medical examination without abnormal findings: Secondary | ICD-10-CM

## 2023-09-15 DIAGNOSIS — Z6836 Body mass index (BMI) 36.0-36.9, adult: Secondary | ICD-10-CM | POA: Diagnosis not present

## 2023-09-15 DIAGNOSIS — E66812 Obesity, class 2: Secondary | ICD-10-CM | POA: Diagnosis not present

## 2023-09-15 DIAGNOSIS — E6609 Other obesity due to excess calories: Secondary | ICD-10-CM | POA: Diagnosis not present

## 2023-09-15 MED ORDER — ZEPBOUND 5 MG/0.5ML ~~LOC~~ SOAJ: 5.0000 mg | SUBCUTANEOUS | 0 refills | Status: DC

## 2023-09-15 NOTE — Progress Notes (Signed)
 Subjective:   Kimberly Salinas [facility] 06-15-1990  09/15/2023   CC: Chief Complaint  Patient presents with   Annual Exam  Discussed the use of AI scribe software for clinical note transcription with the patient, who gave verbal consent to proceed.  HPI: Kimberly Salinas is a 33 y.o. female who presents for a routine health maintenance exam.  Fasting labs collected at previous visit.   Her cholesterol levels are within the desired range, with a total cholesterol of 150 mg/dL and LDL of 88 mg/dL. Thyroid function tests are normal. Hemoglobin A1c has changed from 6% four months ago to 5.7%.  She has been taking an iron  supplement as prescribed after her last visit, which has improved her symptoms of fatigue. She feels better since starting the supplement with food.  She is currently on Zepbound  2.5 mg but has been hesitant to self-inject due to a previous incident where the medication squirted out, causing her to feel scared. She has only injected herself once. Initially, the medication caused significant nausea, but her body adjusted over time, and her appetite returned.  She continues to experience severe allergies, with symptoms including throat irritation, mucus sensation, and frequent nose blowing and coughing. She has been taking Claritin but still feels 'thickness' in her throat and difficulty swallowing.  No issues with bowel movements but reports some pain in the lower abdomen when pressure is applied. She mentions experiencing a sharp pain in her abdomen when bending over, which she describes as 'really bad pain.'     HEALTH SCREENINGS: - Vision Screening: up to date - Dental Visits: up to date - Pap smear: declines today  - Breast Exam: declines today  - STD Screening: Declined - Mammogram (40+): Not applicable  - Colonoscopy (45+): Not applicable  - Bone Density (65+ or under 65 with predisposing conditions): Not applicable  - Lung CA screening with low-dose CT:  Not  applicable Adults age 11-80 who are current cigarette smokers or quit within the last 15 years. Must have 20 pack year history.   Depression and Anxiety Screen done today and results listed below:     08/17/2023   10:20 AM 06/22/2023   10:13 AM 05/11/2023   10:11 AM 04/21/2023    4:10 PM  Depression screen PHQ 2/9  Decreased Interest 0 1 0 1  Down, Depressed, Hopeless 0 1 0 0  PHQ - 2 Score 0 2 0 1  Altered sleeping 2 1 1 3   Tired, decreased energy 3 3 1 3   Change in appetite 2 0 3 3  Feeling bad or failure about yourself  2 1 1  0  Trouble concentrating 2 2 1  0  Moving slowly or fidgety/restless 0 0 0 0  Suicidal thoughts 0 0 0 0  PHQ-9 Score 11 9 7 10   Difficult doing work/chores Not difficult at all Somewhat difficult Somewhat difficult Somewhat difficult      08/17/2023   10:20 AM 05/11/2023   10:12 AM 04/21/2023    4:10 PM  GAD 7 : Generalized Anxiety Score  Nervous, Anxious, on Edge 2 0 0  Control/stop worrying 2 3 1   Worry too much - different things 2 3 1   Trouble relaxing 2 3 1   Restless 2 1 1   Easily annoyed or irritable 2 2 1   Afraid - awful might happen 2 1 1   Total GAD 7 Score 14 13 6   Anxiety Difficulty Not difficult at all Not difficult at all Not difficult at all  IMMUNIZATIONS: - Tdap: Tetanus vaccination status reviewed: last tetanus booster within 10 years. - HPV: Up to date - Influenza: Refused - Pneumovax: Not applicable - Prevnar 20: Not applicable - Zostavax (50+): Not applicable  Past medical history, surgical history, medications, allergies, family history and social history reviewed with patient today and changes made to appropriate areas of the chart.   Past Medical History:  Diagnosis Date   Anemia     Past Surgical History:  Procedure Laterality Date   NO PAST SURGERIES      Current Outpatient Medications on File Prior to Visit  Medication Sig   ketoconazole  (NIZORAL ) 2 % shampoo Apply 1 Application topically 2 (two) times a week.    loratadine (CLARITIN) 10 MG tablet Take 10 mg by mouth daily.   tirzepatide  (ZEPBOUND ) 2.5 MG/0.5ML injection vial Inject 2.5 mg into the skin once a week.   No current facility-administered medications on file prior to visit.    No Known Allergies   Social History   Socioeconomic History   Marital status: Single    Spouse name: Not on file   Number of children: 2   Years of education: Not on file   Highest education level: Associate degree: academic program  Occupational History   Not on file  Tobacco Use   Smoking status: Never    Passive exposure: Never   Smokeless tobacco: Never  Vaping Use   Vaping status: Never Used  Substance and Sexual Activity   Alcohol use: No   Drug use: No   Sexual activity: Not Currently    Birth control/protection: None  Other Topics Concern   Not on file  Social History Narrative   Not on file   Social Drivers of Health   Financial Resource Strain: Low Risk  (08/17/2023)   Overall Financial Resource Strain (CARDIA)    Difficulty of Paying Living Expenses: Not hard at all  Food Insecurity: No Food Insecurity (08/17/2023)   Hunger Vital Sign    Worried About Running Out of Food in the Last Year: Never true    Ran Out of Food in the Last Year: Never true  Transportation Needs: No Transportation Needs (08/17/2023)   PRAPARE - Administrator, Civil Service (Medical): No    Lack of Transportation (Non-Medical): No  Physical Activity: Inactive (08/17/2023)   Exercise Vital Sign    Days of Exercise per Week: 0 days    Minutes of Exercise per Session: Not on file  Stress: Stress Concern Present (08/17/2023)   [employer] of Occupational Health - Occupational Stress Questionnaire    Feeling of Stress: Rather much  Social Connections: Moderately Isolated (08/17/2023)   Social Connection and Isolation Panel    Frequency of Communication with Friends and Family: Once a week    Frequency of Social Gatherings with Friends and Family:  More than three times a week    Attends Religious Services: More than 4 times per year    Active Member of [employer] or Organizations: No    Attends Engineer, structural: Not on file    Marital Status: Never married  Intimate Partner Violence: Unknown (07/31/2022)   Received from [facility]   HITS    Physically Hurt: Not on file    Insult or Talk Down To: Not on file    Threaten Physical Harm: Not on file    Scream or Curse: Not on file   Social History   Tobacco Use  Smoking Status Never  Passive exposure: Never  Smokeless Tobacco Never   Social History   Substance and Sexual Activity  Alcohol Use No    Family History  Problem Relation Age of Onset   Diabetes Paternal Aunt    Hypertension Paternal Aunt    Diabetes Paternal Uncle    Hypertension Paternal Uncle    Anemia Mother    Cancer Paternal Grandfather        Unknown type    Breast cancer Maternal Aunt    Anesthesia problems Neg Hx    Hypotension Neg Hx    Malignant hyperthermia Neg Hx    Pseudochol deficiency Neg Hx    Ovarian cancer Neg Hx    Colon cancer Neg Hx      ROS: Denies fever, fatigue, unexplained weight loss/gain, chest pain, SHOB, and palpitations. Denies neurological deficits, gastrointestinal or genitourinary complaints, and skin changes.   Objective:   Today's Vitals   09/15/23 1037  BP: 107/70  Pulse: 75  Resp: 16  SpO2: 99%  Weight: 216 lb (98 kg)  Height: 5' 4 (1.626 m)  PainSc: 0-No pain    GENERAL APPEARANCE: Well-appearing, in NAD. Well nourished.  SKIN: Pink, warm and dry. Turgor normal. No rash, lesion, ulceration, or ecchymoses. Hair evenly distributed.  HEENT: HEAD: Normocephalic.  EYES: PERRLA. EOMI. Lids intact w/o defect. Sclera white, Conjunctiva pink w/o exudate.  EARS: External ear w/o redness, swelling, masses or lesions. EAC clear. TM's intact, translucent w/o bulging, appropriate landmarks visualized. Appropriate acuity to conversational tones.  NOSE:  Septum midline w/o deformity. Nares patent, mucosa pink and non-inflamed w/o drainage. No sinus tenderness.  THROAT: Uvula midline. Oropharynx clear. Tonsils non-inflamed w/o exudate. Oral mucosa pink and moist.  NECK: Supple, Trachea midline. Full ROM w/o pain or tenderness. No lymphadenopathy. Thyroid non-tender w/o enlargement or palpable masses.  BREASTS: Deferred.  RESPIRATORY: Chest wall symmetrical w/o masses. Respirations even and non-labored. Breath sounds clear to auscultation bilaterally. No wheezes, rales, rhonchi, or crackles. CARDIAC: S1, S2 present, regular rate and rhythm. No gallops, murmurs, rubs, or clicks. PMI w/o lifts, heaves, or thrills. No carotid bruits. Capillary refill <2 seconds. Peripheral pulses 2+ bilaterally. GI: Abdomen soft w/o distention. Normoactive bowel sounds. No palpable masses or tenderness. No guarding or rebound tenderness. Liver and spleen w/o tenderness or enlargement. No CVA tenderness.  GU: Deferred.  MSK: Muscle tone and strength appropriate for age, w/o atrophy or abnormal movement.  EXTREMITIES: Active ROM intact, w/o tenderness, crepitus, or contracture. No obvious joint deformities or effusions. No clubbing, edema, or cyanosis.  NEUROLOGIC: CN's II-XII intact. Motor strength symmetrical with no obvious weakness. No sensory deficits. DTR's 2+ symmetric bilaterally. Steady, even gait.  PSYCH/MENTAL STATUS: Alert, oriented x 3. Cooperative, appropriate mood and affect.   Results for orders placed or performed in visit on 09/07/23  Hemoglobin A1c   Collection Time: 09/07/23  8:47 AM  Result Value Ref Range   Hgb A1c MFr Bld 5.7 (H) 4.8 - 5.6 %   Est. average glucose Bld gHb Est-mCnc 117 mg/dL  TSH   Collection Time: 09/07/23  8:47 AM  Result Value Ref Range   TSH 3.230 0.450 - 4.500 uIU/mL  Lipid panel   Collection Time: 09/07/23  8:47 AM  Result Value Ref Range   Cholesterol, Total 150 100 - 199 mg/dL   Triglycerides 884 0 - 149 mg/dL   HDL  41 >60 mg/dL   VLDL Cholesterol Cal 21 5 - 40 mg/dL   LDL Chol Calc (NIH) 88 0 -  99 mg/dL   Chol/HDL Ratio 3.7 0.0 - 4.4 ratio    Assessment & Plan:   1. Wellness examination (Primary) - Encouraged a healthy well-balanced diet. Patient may adjust caloric intake to maintain or achieve ideal body weight. May reduce intake of dietary saturated fat and total fat and have adequate dietary potassium and calcium preferably from fresh fruits, vegetables, and low-fat dairy products.   - Advised to avoid cigarette smoking. - Discussed with the patient that most people either abstain from alcohol or drink within safe limits (<=14/week and <=4 drinks/occasion for males, <=7/weeks and <= 3 drinks/occasion for females) and that the risk for alcohol disorders and other health effects rises proportionally with the number of drinks per week and how often a drinker exceeds daily limits. - Discussed cessation/primary prevention of drug use and availability of treatment for abuse.  - Discussed sexually transmitted diseases, avoidance of unintended pregnancy and contraceptive alternatives. - Stressed the importance of regular exercise - Injury prevention: Discussed safety belts, safety helmets, smoke detector, smoking near bedding or upholstery.  - Dental health: Discussed importance of regular tooth brushing, flossing, and dental visits.  NEXT PREVENTATIVE PHYSICAL DUE IN 1 YEAR.    Return for pap ONLY & flu shot .  Patient to reach out to office if new, worrisome, or unresolved symptoms arise or if no improvement in patient's condition. Patient verbalized understanding and is agreeable to treatment plan. All questions answered to patient's satisfaction.   Evalene Arts, FNP

## 2023-09-15 NOTE — Patient Instructions (Signed)
 Health Maintenance Recommendations Screening Testing Mammogram Every 1 -2 years based on history and risk factors Starting at age 31 Pap Smear Ages 21-39 every 3 years Ages 23-65 every 5 years with HPV testing More frequent testing may be required based on results and history Colon Cancer Screening Every 1-10 years based on test performed, risk factors, and history Starting at age 102 Bone Density Screening Every 2-10 years based on history Starting at age 69 for women Recommendations for men differ based on medication usage, history, and risk factors AAA Screening One time ultrasound Men 30-10 years old who have every smoked Lung Cancer Screening Low Dose Lung CT every 12 months Age 20-80 years with a 30 pack-year smoking history who still smoke or who have quit within the last 15 years   Screening Labs Routine  Labs: Complete Blood Count (CBC), Complete Metabolic Panel (CMP), Cholesterol (Lipid Panel) Every 6-12 months based on history and medications May be recommended more frequently based on current conditions or previous results Hemoglobin A1c Lab Every 3-12 months based on history and previous results Starting at age 24 or earlier with diagnosis of diabetes, high cholesterol, BMI >26, and/or risk factors Frequent monitoring for patients with diabetes to ensure blood sugar control Thyroid Panel (TSH w/ T3 & T4) Every 6 months based on history, symptoms, and risk factors May be repeated more often if on medication HIV One time testing for all patients 23 and older May be repeated more frequently for patients with increased risk factors or exposure Hepatitis C One time testing for all patients 47 and older May be repeated more frequently for patients with increased risk factors or exposure Gonorrhea, Chlamydia Every 12 months for all sexually active persons 13-24 years Additional monitoring may be recommended for those who are considered high risk or who have  symptoms PSA Men 72-66 years old with risk factors Additional screening may be recommended from age 2-69 based on risk factors, symptoms, and history   Vaccine Recommendations Tetanus Booster All adults every 10 years Flu Vaccine All patients 6 months and older every year COVID Vaccine All patients 12 years and older Initial dosing with booster May recommend additional booster based on age and health history HPV Vaccine 2 doses all patients age 56-26 Dosing may be considered for patients over 26 Shingles Vaccine (Shingrix) 2 doses all adults 55 years and older Pneumonia (Pneumovax 23) All adults 65 years and older May recommend earlier dosing based on health history Pneumonia (Prevnar 16) All adults 65 years and older Dosed 1 year after Pneumovax 23   Additional Screening, Testing, and Vaccinations may be recommended on an individualized basis based on family history, health history, risk factors, and/or exposure.  __________________________________________________________   Diet Recommendations for All Patients   I recommend that all patients maintain a diet low in saturated fats, carbohydrates, and cholesterol. While this can be challenging at first, it is not impossible and small changes can make big differences.  Things to try: Decreasing the amount of soda, sweet tea, and/or juice to one or less per day and replace with water While water is always the first choice, if you do not like water you may consider adding a water additive without sugar to improve the taste other sugar free drinks Replace potatoes with a brightly colored vegetable at dinner Use healthy oils, such as canola oil or olive oil, instead of butter or hard margarine Limit your bread intake to two pieces or less a day Replace regular pasta with  low carb pasta options Bake, broil, or grill foods instead of frying Monitor portion sizes  Eat smaller, more frequent meals throughout the day instead of large  meals   An important thing to remember is, if you love foods that are not great for your health, you don't have to give them up completely. Instead, allow these foods to be a reward when you have done well. Allowing yourself to still have special treats every once in a while is a nice way to tell yourself thank you for working hard to keep yourself healthy.    Also remember that every day is a new day. If you have a bad day and "fall off the wagon", you can still climb right back up and keep moving along on your journey!   We have resources available to help you!  Some websites that may be helpful include: www.http://www.wall-moore.info/        Www.VeryWellFit.com _____________________________________________________________   Activity Recommendations for All Patients   I recommend that all adults get at least 20 minutes of moderate physical activity that elevates your heart rate at least 5 days out of the week.  Some examples include: Walking or jogging at a pace that allows you to carry on a conversation Cycling (stationary bike or outdoors) Water aerobics Yoga Weight lifting Dancing If physical limitations prevent you from putting stress on your joints, exercise in a pool or seated in a chair are excellent options.   Do determine your MAXIMUM heart rate for activity: YOUR AGE - 220 = MAX HeartRate    Remember! Do not push yourself too hard.  Start slowly and build up your pace, speed, weight, time in exercise, etc.  Allow your body to rest between exercise and get good sleep. You will need more water than normal when you are exerting yourself. Do not wait until you are thirsty to drink. Drink with a purpose of getting in at least 8, 8 ounce glasses of water a day plus more depending on how much you exercise and sweat.      If you begin to develop dizziness, chest pain, abdominal pain, jaw pain, shortness of breath, headache, vision changes, lightheadedness, or other concerning symptoms, stop the  activity and allow your body to rest. If your symptoms are severe, seek emergency evaluation immediately. If your symptoms are concerning, but not severe, please let us know so that we can recommend further evaluation.

## 2023-10-26 ENCOUNTER — Telehealth: Payer: Self-pay

## 2023-10-26 NOTE — Telephone Encounter (Signed)
(  Key: [id number])  Zepbound  2.5MG /0.5ML pen-injectors  Form [employer] [place]  Electronic Prior Authorization Request Form [phone] NCPDP)

## 2023-10-27 ENCOUNTER — Encounter: Payer: Self-pay | Admitting: Family Medicine

## 2023-10-27 ENCOUNTER — Ambulatory Visit (INDEPENDENT_AMBULATORY_CARE_PROVIDER_SITE_OTHER): Admitting: Family Medicine

## 2023-10-27 ENCOUNTER — Other Ambulatory Visit (HOSPITAL_COMMUNITY)
Admission: RE | Admit: 2023-10-27 | Discharge: 2023-10-27 | Disposition: A | Discharge status: Home / Self Care | Source: Ambulatory Visit | Attending: Family Medicine | Admitting: Family Medicine

## 2023-10-27 VITALS — BP 109/74 | HR 71 | Resp 16 | Ht 64.0 in | Wt 212.0 lb

## 2023-10-27 DIAGNOSIS — Z124 Encounter for screening for malignant neoplasm of cervix: Secondary | ICD-10-CM

## 2023-10-27 NOTE — Progress Notes (Signed)
   Established Patient Office Visit  Subjective  Patient ID: Kimberly Salinas, female    DOB: [birth year]  Age: 33 y.o. MRN: [id number]  Chief Complaint  Patient presents with   Gynecologic Exam   Kimberly Salinas is a pleasant 33 year old female patient who presents today for her cervical cancer screening. She has no concerns today.   ROS: see HPI     Objective:     BP 109/74   Pulse 71   Resp 16   Ht 5' 4 (1.626 m)   Wt 212 lb (96.2 kg)   SpO2 99%   BMI 36.39 kg/m  BP Readings from Last 3 Encounters:  10/27/23 109/74  09/15/23 107/70  08/17/23 110/75     Physical Exam Vitals reviewed. Exam conducted with a chaperone present.  Constitutional:      Appearance: Normal appearance.  Cardiovascular:     Rate and Rhythm: Normal rate and regular rhythm.     Pulses: Normal pulses.     Heart sounds: Normal heart sounds.  Pulmonary:     Effort: Pulmonary effort is normal.     Breath sounds: Normal breath sounds.  Abdominal:     Hernia: There is no hernia in the left inguinal area or right inguinal area.  Genitourinary:    General: Normal vulva.     Exam position: Lithotomy position.     Labia:        Right: No rash, tenderness, lesion or injury.        Left: No rash, tenderness, lesion or injury.      Vagina: Normal.     Cervix: Friability and lesion (white spots on cervix) present.  Lymphadenopathy:     Lower Body: No right inguinal adenopathy. No left inguinal adenopathy.  Neurological:     Mental Status: She is alert.  Psychiatric:        Mood and Affect: Mood normal.        Behavior: Behavior normal.     Assessment & Plan:   1. Screening for cervical cancer (Primary) Patient presents today for cervical cancer screening. Patient agreeable to procedure today- does not want STI testing. Patient has no concerns today and no symptoms. Chaperone (Jamie Holden, CMA) present for physical exam. No inguinal lymphadenopathy present. Vulva with appropriate hair  distribution, no lesions present. Vagina with no atrophy noted, no lesions, cystocele, or rectocele present. Urethra with no masses, tenderness or scarring present. Cervical os visualized. Cervix non-tender, nulliparous. Scant amount of white discharge and blood present at os opening. Small white lesions present on cervix. Will update patient with test results.   - Cytology - PAP  Return if symptoms worsen or fail to improve.    Evalene Arts, FNP

## 2023-10-27 NOTE — Patient Instructions (Signed)

## 2023-10-29 ENCOUNTER — Other Ambulatory Visit: Payer: Self-pay | Admitting: Family Medicine

## 2023-10-29 ENCOUNTER — Ambulatory Visit: Payer: Self-pay | Admitting: Family Medicine

## 2023-10-29 LAB — CYTOLOGY - PAP
Chlamydia: NEGATIVE
Comment: NEGATIVE
Comment: NEGATIVE
Comment: NEGATIVE
Comment: NORMAL
Diagnosis: NEGATIVE
High risk HPV: NEGATIVE
Neisseria Gonorrhea: NEGATIVE
Trichomonas: NEGATIVE

## 2023-10-29 MED ORDER — PHENTERMINE HCL 15 MG PO CAPS: 15.0000 mg | ORAL_CAPSULE | ORAL | 0 refills | Status: DC

## 2023-12-02 ENCOUNTER — Ambulatory Visit: Payer: Self-pay

## 2023-12-02 ENCOUNTER — Ambulatory Visit (INDEPENDENT_AMBULATORY_CARE_PROVIDER_SITE_OTHER): Admitting: Family Medicine

## 2023-12-02 ENCOUNTER — Other Ambulatory Visit: Payer: Self-pay | Admitting: Family Medicine

## 2023-12-02 VITALS — BP 114/77 | HR 83 | Temp 98.2°F | Resp 18 | Ht 64.0 in | Wt 213.0 lb

## 2023-12-02 DIAGNOSIS — R079 Chest pain, unspecified: Secondary | ICD-10-CM | POA: Diagnosis not present

## 2023-12-02 LAB — CBC WITH DIFFERENTIAL/PLATELET
Basophils Absolute: 0 x10E3/uL (ref 0.0–0.2)
Basos: 0 %
EOS (ABSOLUTE): 0.2 x10E3/uL (ref 0.0–0.4)
Eos: 3 %
Hematocrit: 32.4 % — ABNORMAL LOW (ref 34.0–46.6)
Hemoglobin: 10.6 g/dL — ABNORMAL LOW (ref 11.1–15.9)
Immature Grans (Abs): 0 x10E3/uL (ref 0.0–0.1)
Immature Granulocytes: 0 %
Lymphocytes Absolute: 2.1 x10E3/uL (ref 0.7–3.1)
Lymphs: 26 %
MCH: 26.7 pg (ref 26.6–33.0)
MCHC: 32.7 g/dL (ref 31.5–35.7)
MCV: 82 fL (ref 79–97)
Monocytes Absolute: 0.5 x10E3/uL (ref 0.1–0.9)
Monocytes: 6 %
Neutrophils Absolute: 5.2 x10E3/uL (ref 1.4–7.0)
Neutrophils: 65 %
Platelets: 343 x10E3/uL (ref 150–450)
RBC: 3.97 x10E6/uL (ref 3.77–5.28)
RDW: 14.2 % (ref 11.7–15.4)
WBC: 8.1 x10E3/uL (ref 3.4–10.8)

## 2023-12-02 MED ORDER — PANTOPRAZOLE SODIUM 20 MG PO TBEC: 20.0000 mg | DELAYED_RELEASE_TABLET | Freq: Two times a day (BID) | ORAL | 0 refills | Status: DC

## 2023-12-02 MED ORDER — SUCRALFATE 1 G PO TABS: 1.0000 g | ORAL_TABLET | Freq: Four times a day (QID) | ORAL | 0 refills | Status: DC

## 2023-12-02 NOTE — Progress Notes (Signed)
 Established Patient Office Visit  Subjective   Patient ID: Kimberly Salinas, female    DOB: 1990/10/17  Age: 33 y.o. MRN: [id number]  Chief Complaint  Patient presents with   Bronchitis    Was Dx on 11/19/23.   Chest Pain    HPI Discussed the use of AI scribe software for clinical note transcription with the patient, who gave verbal consent to proceed.  History of Present Illness   Kimberly Salinas is a 33 year old female who presents with persistent chest discomfort following a recent episode of bronchitis.  She experienced wheezing and coughing and was told she had bronchitis approximately two weeks ago. Chest discomfort began two days after the onset of bronchitis and has persisted despite the resolution of other symptoms. The discomfort is located in the middle of her chest, the epigastric region sometimes radiating towards her back, and varies in intensity. No severe pain, sweating, SOB or pain associated with eating. She has taken Tylenol  and ibuprofen  occasionally for relief.  She visited urgent care on November 6th and 8th, where a chest x-ray was performed and found to be normal. She used her daughter's albuterol inhaler, which provided significant relief from wheezing. Although prescribed her own inhaler, she did not pick it up, opting to use the one at home. She was also prescribed doxycycline, which she reports caused significant stomach discomfort.  She reports a significant amount of mucus and drainage down the back of her throat, leading to throat irritation and frequent swallowing. No coughing but continues to clear her throat. She has tried allergy medications, teas, honey, turmeric, and ginger for relief, with some improvement noted with allergy medication.  She drinks water and juices, and consumes one cup of espresso daily. 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 She has started taking Claritin for her  allergies, which have been particularly severe this year, though she notes it is not as effective as before.        Objective:     BP 114/77 (BP Location: Left Arm, Patient Position: Sitting, Cuff Size: Normal)   Pulse 83   Temp 98.2 F (36.8 C) (Oral)   Resp 18   Ht 5' 4 (1.626 m)   Wt 213 lb (96.6 kg)   LMP 11/19/2023 (Approximate)   SpO2 97%   BMI 36.56 kg/m    Physical Exam Vitals and nursing note reviewed.  Constitutional:      Appearance: Normal appearance.  HENT:     Head: Normocephalic and atraumatic.     Right Ear: Tympanic membrane, ear canal and external ear normal. There is no impacted cerumen.     Left Ear: Tympanic membrane, ear canal and external ear normal. There is no impacted cerumen.     Nose: Congestion present.     Right Turbinates: Swollen.     Left Turbinates: Swollen.     Mouth/Throat:     Pharynx: Oropharynx is clear. Posterior oropharyngeal erythema present.  Eyes:     Conjunctiva/sclera: Conjunctivae normal.  Cardiovascular:     Rate and Rhythm: Normal rate and regular rhythm.  Pulmonary:     Effort: Pulmonary effort is normal.     Breath sounds: Normal breath sounds.  Musculoskeletal:     Cervical back: Neck supple. No tenderness.  Lymphadenopathy:     Cervical: No cervical adenopathy.  Skin:    General: Skin is warm and dry.  Neurological:  Mental Status: She is alert and oriented to person, place, and time.  Psychiatric:        Mood and Affect: Mood normal.        Behavior: Behavior normal.        Thought Content: Thought content normal.        Judgment: Judgment normal.          No results found for any visits on 12/02/23.    The ASCVD Risk score (Arnett DK, et al., 2019) failed to calculate for the following reasons:   The 2019 ASCVD risk score is only valid for ages 79 to 61    Assessment & Plan:  Chest pain, unspecified type -     EKG 12-Lead -     Lipase -     CMP14+EGFR -     CBC with  Differential/Platelet  Other orders -     Sucralfate ; Take 1 tablet (1 g total) by mouth 4 (four) times daily.  Dispense: 120 tablet; Refill: 0 -     Pantoprazole  Sodium; Take 1 tablet (20 mg total) by mouth 2 (two) times daily.  Dispense: 60 tablet; Refill: 0     Return in about 4 weeks (around 12/30/2023).    Lyanne Kates K Iridian Reader, MD

## 2023-12-02 NOTE — Telephone Encounter (Signed)
 FYI Only or Action Required?: FYI only for provider: appointment scheduled on 12/02/23.  Patient was last seen in primary care on 10/27/2023 by Towana Small, FNP.  Called Nurse Triage reporting Chest Pain.  Symptoms began 1-2 weeks.  Interventions attempted: OTC medications: Tylenol , ibuprofen .  Symptoms are: intermittent, random mild to moderate central chest pain radiates to epigastric, intermittent hot flashes stable.  Triage Disposition: See Physician Within 24 Hours  Patient/caregiver understands and will follow disposition?: Yes         Copied from CRM [id number]. Topic: Clinical - Red Word Triage >> Dec 02, 2023 11:41 AM Dedra NOVAK wrote: [facility] that prompted transfer to Nurse Triage: Pt experiencing chest discomfort and pain that she says is coming and going. Warm transfer to NT. Reason for Disposition  [1] Chest pain lasts < 5 minutes AND [2] NO chest pain or cardiac symptoms (e.g., breathing difficulty, sweating) now  (Exception: Chest pains that last only a few seconds.)  Answer Assessment - Initial Assessment Questions 1. LOCATION: Where does it hurt?       Center of breasts.  2. RADIATION: Does the pain go anywhere else? (e.g., into neck, jaw, arms, back)     Down to abdomen/epigastric.  3. ONSET: When did the chest pain begin? (Minutes, hours or days)      1-2 weeks ago, she states she was recovering from her bronchitis. She went to urgent care on 11/21/23 for the pain and was told it was residual from the bronchitis.  4. PATTERN: Does the pain come and go, or has it been constant since it started?  Does it get worse with exertion?      Comes and goes, describes it as feeling throughout the day. Random; not worse with exertion.  5. DURATION: How long does it last (e.g., seconds, minutes, hours)     1 minute.  6. SEVERITY: How bad is the pain?  (e.g., Scale 1-10; mild, moderate, or severe)     Discomfort. Not present now. Mild to moderate,  not severe.  7. CARDIAC RISK FACTORS: Do you have any history of heart problems or risk factors for heart disease? (e.g., angina, prior heart attack; diabetes, high blood pressure, high cholesterol, smoker, or strong family history of heart disease)     No.  8. PULMONARY RISK FACTORS: Do you have any history of lung disease?  (e.g., blood clots in lung, asthma, emphysema, birth control pills)     No.  9. CAUSE: What do you think is causing the chest pain?     Unsure, concerned due to recent bronchitis.  10. OTHER SYMPTOMS: Do you have any other symptoms? (e.g., dizziness, nausea, vomiting, sweating, fever, difficulty breathing, cough)       Patient states her fever, cough and bronchitis have been improved and resolved for a few days. She states she has some hot flashes intermittently, not drenching sweats. Denies nausea, vomiting, dizziness, syncope, SOB, sweating.  11. PREGNANCY: Is there any chance you are pregnant? When was your last menstrual period?       LMP: 1 week ago.  Protocols used: Chest Pain-A-AH

## 2023-12-02 NOTE — Assessment & Plan Note (Signed)
 Pain is in the epigastric region and radiates through to her back at times.  EKG NSR with sinus arrhythmia.  ST segments and T waves are normal.  Will check lipase CMP and CBC. Trial of sucralfate  1 g 4 times a day and pantoprazole  sodium 20 mg 1 twice daily.  Please follow-up in a month.

## 2023-12-03 ENCOUNTER — Ambulatory Visit: Payer: Self-pay | Admitting: Family Medicine

## 2023-12-03 LAB — CMP14+EGFR
ALT: 14 IU/L (ref 0–32)
AST: 17 IU/L (ref 0–40)
Albumin: 4.3 g/dL (ref 4.0–5.0)
Alkaline Phosphatase: 62 IU/L (ref 41–116)
BUN/Creatinine Ratio: 16 (ref 9–23)
BUN: 12 mg/dL (ref 6–20)
Bilirubin Total: <0.2 mg/dL (ref 0.0–1.2)
CO2: 22 mmol/L (ref 20–29)
Calcium: 9.5 mg/dL (ref 8.7–10.2)
Chloride: 104 mmol/L (ref 96–106)
Creatinine, Ser: 0.74 mg/dL (ref 0.57–1.00)
Globulin, Total: 2.7 g/dL (ref 1.5–4.5)
Glucose: 90 mg/dL (ref 70–99)
Potassium: 4.2 mmol/L (ref 3.5–5.2)
Sodium: 140 mmol/L (ref 134–144)
Total Protein: 7 g/dL (ref 6.0–8.5)
eGFR: 112 mL/min/1.73 (ref >59)

## 2023-12-03 LAB — LIPASE: Lipase: 30 U/L (ref 14–72)

## 2023-12-31 ENCOUNTER — Ambulatory Visit: Admitting: Family Medicine

## 2024-01-01 ENCOUNTER — Ambulatory Visit: Admitting: Family Medicine

## 2024-02-05 ENCOUNTER — Encounter: Payer: Self-pay | Admitting: Family Medicine

## 2024-02-05 ENCOUNTER — Ambulatory Visit: Admitting: Family Medicine

## 2024-02-05 VITALS — BP 114/78 | HR 85 | Resp 16 | Ht 64.0 in | Wt 213.0 lb

## 2024-02-05 DIAGNOSIS — N920 Excessive and frequent menstruation with regular cycle: Secondary | ICD-10-CM

## 2024-02-05 DIAGNOSIS — E66812 Obesity, class 2: Secondary | ICD-10-CM

## 2024-02-05 DIAGNOSIS — Z6836 Body mass index (BMI) 36.0-36.9, adult: Secondary | ICD-10-CM | POA: Diagnosis not present

## 2024-02-05 DIAGNOSIS — F3281 Premenstrual dysphoric disorder: Secondary | ICD-10-CM | POA: Diagnosis not present

## 2024-02-05 DIAGNOSIS — E6609 Other obesity due to excess calories: Secondary | ICD-10-CM

## 2024-02-05 DIAGNOSIS — R7303 Prediabetes: Secondary | ICD-10-CM | POA: Diagnosis not present

## 2024-02-05 DIAGNOSIS — E282 Polycystic ovarian syndrome: Secondary | ICD-10-CM | POA: Diagnosis not present

## 2024-02-05 DIAGNOSIS — L219 Seborrheic dermatitis, unspecified: Secondary | ICD-10-CM | POA: Diagnosis not present

## 2024-02-05 MED ORDER — PHENTERMINE HCL 15 MG PO CAPS: 15.0000 mg | ORAL_CAPSULE | ORAL | 0 refills | Status: AC

## 2024-02-05 MED ORDER — FLUOCINOLONE ACETONIDE SCALP 0.01 % EX OIL: TOPICAL_OIL | CUTANEOUS | 2 refills | Status: DC

## 2024-02-05 MED ORDER — KETOCONAZOLE 2 % EX SHAM: MEDICATED_SHAMPOO | CUTANEOUS | 2 refills | Status: AC

## 2024-02-05 NOTE — Progress Notes (Signed)
 "     Acute Care Office Visit  Subjective:   Kimberly Salinas [birth year] 02/05/2024  Chief Complaint  Patient presents with   Hair/Scalp Problem    Used a RX shampoo in past wants to try again.     Generalized Body Aches    During PMS and Stress   Discussed the use of AI scribe software for clinical note transcription with the patient, who gave verbal consent to proceed.  History of Present Illness   Kimberly Salinas is a 34 year old female with PCOS who presents with worsening dry scalp and body aches.  She has been experiencing a worsening of her dry scalp condition, described as 'really thick' and 'almost scabby type' in the front and on the side of her scalp. She has tried various treatments including expensive shampoos, medicated shampoos, and white vinegar, but none have provided relief. A medicated shampoo prescribed previously helped slightly, but she did not pick up the last prescription. The condition is very itchy and visible, causing her to try to hide it.  She experiences 'really bad' body aches, particularly two weeks before her menstrual period, but sometimes randomly. These aches are described as flu-like symptoms associated with her PMS, including feeling extremely sick, drained, and experiencing headaches and sweats. These symptoms have worsened recently and last for about two weeks before her period. Her menstrual cycle is heavy on the first two days and then tapers off by the third or fourth day. She has not been on medication for PMS previously and has noticed an increase in symptoms such as body aches, sweats, and headaches. Minor exercise seems to alleviate some of the body aches.  She has a history of PCOS and prediabetes and is concerned about recent headaches that she associates with eating sweets or stress. She reports that she was told her A1c was elevated during her last physical in September. Her mother has mentioned that her symptoms sound like  premenopausal symptoms, which her mother also experiences. No cramping in the abdomen but describes body aches similar to those experienced when getting sick. She reports sweating and feeling hot, but not having a fever. No specific timing of headaches related to her menstrual cycle.      The following portions of the patient's history were reviewed and updated as appropriate: past medical history, past surgical history, family history, social history, allergies, medications, and problem list.   Patient Active Problem List   Diagnosis Date Noted   Chest pain, unspecified 12/02/2023   Class 2 obesity due to excess calories without serious comorbidity with body mass index (BMI) of 36.0 to 36.9 in adult 06/22/2023   BMI 34.0-34.9,adult 09/18/2022   Class 1 obesity with body mass index (BMI) of 34.0 to 34.9 in adult 09/18/2022   PCOS (polycystic ovarian syndrome) 09/18/2022   Prediabetes 09/18/2022   Acute anxiety 04/06/2019   Tremor 04/06/2019   Menorrhagia with regular cycle 09/21/2018   Iron  deficiency anemia 11/03/2017   Vitamin D  deficiency 06/11/2017   Past Medical History:  Diagnosis Date   Anemia    Past Surgical History:  Procedure Laterality Date   NO PAST SURGERIES     Family History  Problem Relation Age of Onset   Diabetes Paternal Aunt    Hypertension Paternal Aunt    Diabetes Paternal Uncle    Hypertension Paternal Uncle    Anemia Mother    Cancer Paternal Grandfather        Unknown type  Breast cancer Maternal Aunt    Anesthesia problems Neg Hx    Hypotension Neg Hx    Malignant hyperthermia Neg Hx    Pseudochol deficiency Neg Hx    Ovarian cancer Neg Hx    Colon cancer Neg Hx    Outpatient Medications Prior to Visit  Medication Sig Dispense Refill   ferrous sulfate  (FEROSUL) 325 (65 FE) MG tablet Take 325 mg by mouth. (Patient not taking: Reported on 02/05/2024)     ketoconazole  (NIZORAL ) 2 % shampoo Apply 1 Application topically 2 (two) times a week.  (Patient not taking: Reported on 02/05/2024) 120 mL 0   loratadine (CLARITIN) 10 MG tablet Take 10 mg by mouth daily. (Patient not taking: Reported on 02/05/2024)     pantoprazole  (PROTONIX ) 20 MG tablet TAKE 1 TABLET(20 MG) BY MOUTH TWICE DAILY (Patient not taking: Reported on 02/05/2024) 180 tablet 1   phentermine  15 MG capsule Take 1 capsule (15 mg total) by mouth every morning. (Patient not taking: Reported on 02/05/2024) 30 capsule 0   sucralfate  (CARAFATE ) 1 g tablet Take 1 tablet (1 g total) by mouth 4 (four) times daily. (Patient not taking: Reported on 02/05/2024) 120 tablet 0   tirzepatide  (ZEPBOUND ) 2.5 MG/0.5ML injection vial Inject 2.5 mg into the skin once a week. (Patient not taking: Reported on 02/05/2024) 2 mL 0   tirzepatide  (ZEPBOUND ) 5 MG/0.5ML Pen Inject 5 mg into the skin once a week. (Patient not taking: Reported on 02/05/2024) 2 mL 0   No facility-administered medications prior to visit.   Allergies[1]  ROS: A complete ROS was performed with pertinent positives/negatives noted in the HPI. The remainder of the ROS are negative.    Objective:   Today's Vitals   02/05/24 1108  BP: 114/78  Pulse: 85  Resp: 16  Weight: 213 lb (96.6 kg)  Height: 5' 4 (1.626 m)  PainSc: 0-No pain   Physical Exam Cardiovascular:     Rate and Rhythm: Normal rate and regular rhythm.     Pulses: Normal pulses.     Heart sounds: Normal heart sounds.  Pulmonary:     Effort: Pulmonary effort is normal.     Breath sounds: Normal breath sounds.  Neurological:     Mental Status: She is alert.  Psychiatric:        Mood and Affect: Mood normal.        Behavior: Behavior normal.    Assessment & Plan:   1. PMDD (premenstrual dysphoric disorder) (Primary) Symptoms most likely related to premenstrual syndrome. Symptoms worsen around menstrual cycle, possibly due to hormonal shifts and stress. Discussed treatment options including pain management, oral contraceptives, and SSRIs. Consider  ibuprofen  for pain management. Encourage heat application, exercise, and magnesium supplementation. Discussed oral contraceptives for hormonal regulation. Discussed SSRIs, specifically Prozac, for PMS symptoms. Encouraged continued exercise. Patient will consider SSRI management and notify if she would like to start.   2. PCOS (polycystic ovarian syndrome) Contributing to hormonal fluctuations and exacerbating PMS symptoms. Monitor symptoms and consider hormonal regulation options if symptoms persist.  3. Prediabetes Previous elevated A1c. Recent headaches possibly related to sugar intake. Schedule fasting labs to repeat A1c in February.  4. Menorrhagia with regular cycle See #2  5. Seborrheic dermatitis Chronic condition with worsening symptoms. Previous treatment provided partial relief. Prescribed ketoconazole  shampoo and fluocinolone  acetonide oil for scalp application. Medications sent to pharmacy on file.   6. Class 2 obesity due to excess calories without serious comorbidity with body mass index (BMI)  of 36.0 to 36.9 in adult Discussed management with phentermine  for appetite suppression. Addressed concerns about side effects. PDMP reviewed, no red flags present. Prescribed phentermine  15 mg daily. Advised trial of phentermine  with monitoring for side effects.      Return in about 5 weeks (around 03/11/2024) for PMDD & scalp issues & repeat A1c & weight loss .     Patient to reach out to office if new, worrisome, or unresolved symptoms arise or if no improvement in patient's condition. Patient verbalized understanding and is agreeable to treatment plan. All questions answered to patient's satisfaction.    Evalene Arts, FNP      [1] No Known Allergies  "

## 2024-02-05 NOTE — Patient Instructions (Addendum)
 Fluoxetine (Prozac): FDA-approved for PMDD, aka PMS Most studied Helps mood, irritability, anxiety, and physical symptoms Typical dose: 10-20 mg daily   [facility] Phone: [phone] Various locations- ask for [place]    MyChart:  For all urgent or time sensitive needs we ask that you please call the office to avoid delays. Our number is [phone]) [id number]. MyChart is not constantly monitored and due to the large volume of messages a day, replies may take up to 72 business hours.   MyChart Policy: MyChart allows for you to see your visit notes, after visit summary, provider recommendations, lab and tests results, make an appointment, request refills, and contact your provider or the office for non-urgent questions or concerns. Providers are seeing patients during normal business hours and do not have built in time to review MyChart messages.  We ask that you allow a minimum of 3 business days for responses to [place]. For this reason, please do not send urgent requests through MyChart. Please call the office at [phone]. New and ongoing conditions may require a visit. We have virtual and in person visit available for your convenience.  Complex MyChart concerns may require a visit. Your provider may request you schedule a virtual or in person visit to ensure we are providing the best care possible. MyChart messages sent after 11:00 AM on Friday will not be received by the provider until Monday morning.    Lab and Test Results: You will receive your lab and test results on MyChart as soon as they are completed and results have been sent by the lab or testing facility. Due to this service, you will receive your results BEFORE your provider.  I review lab and tests results each morning prior to seeing patients. Some results require collaboration with other providers to ensure you are receiving the most appropriate care. For this reason, we ask that you please  allow a minimum of 3-5 business days from the time the ALL results have been received for your provider to receive and review lab and test results and contact you about these.  Most lab and test result comments from the provider will be sent through MyChart. Your provider may recommend changes to the plan of care, follow-up visits, repeat testing, ask questions, or request an office visit to discuss these results. You may reply directly to this message or call the office at [phone] to provide information for the provider or set up an appointment. In some instances, you will be called with test results and recommendations. Please let us  know if this is preferred and we will make note of this in your chart to provide this for you.    If you have not heard a response to your lab or test results in 5 business days from all results returning to MyChart, please call the office to let us  know. We ask that you please avoid calling prior to this time unless there is an emergent concern. Due to high call volumes, this can delay the resulting process.   After Hours: For all non-emergency after hours needs, please call the office at [phone] and select the option to reach the on-call provider service. On-call services are shared between multiple [facility] offices and therefore it will not be possible to speak directly with your provider. On-call providers may provide medical advice and recommendations, but are unable to provide refills for maintenance medications.  For all emergency or urgent medical needs after normal business hours,  we recommend that you seek care at the closest Urgent Care or Emergency Department to ensure appropriate treatment in a timely manner.  [facility] has a 24 hour emergency room located on the ground floor for your convenience.    Urgent Concerns During the Business Day Providers are seeing patients from 8AM to 5PM, Monday through Thursday, and 8AM  to 12PM on Friday with a busy schedule and are most often not able to respond to non-urgent calls until the end of the day or the next business day. If you should have URGENT concerns during the day, please call and speak to the nurse or schedule a same day appointment so that we can address your concern without delay.    Thank you, again, for choosing me as your health care partner. I appreciate your trust and look forward to learning more about you.    Evalene Arts, FNP-C

## 2024-02-08 ENCOUNTER — Other Ambulatory Visit: Payer: Self-pay

## 2024-02-08 MED ORDER — FLUOCINOLONE ACETONIDE SCALP 0.01 % EX OIL: TOPICAL_OIL | CUTANEOUS | 2 refills | Status: AC

## 2024-02-15 ENCOUNTER — Encounter: Payer: Self-pay | Admitting: Oncology

## 2024-02-15 ENCOUNTER — Telehealth: Payer: Self-pay

## 2024-02-15 ENCOUNTER — Other Ambulatory Visit (HOSPITAL_COMMUNITY): Payer: Self-pay

## 2024-02-15 DIAGNOSIS — F3281 Premenstrual dysphoric disorder: Secondary | ICD-10-CM | POA: Insufficient documentation

## 2024-02-15 NOTE — Telephone Encounter (Signed)
 Pharmacy Patient Advocate Encounter  Insurance verification completed.   The patient is insured through [employer] test claim for Fluocinolone  Acetonide 0.01% oil. Currently a quantity of 20 is a 20 day supply and the co-pay is $4.00 . The current 20 day co-pay is, $4.00.  No PA needed at this time.  This test claim was processed through [facility]- copay amounts may vary at other pharmacies due to pharmacy/plan contracts, or as the patient moves through the different stages of their insurance plan.

## 2024-03-11 ENCOUNTER — Ambulatory Visit: Admitting: Family Medicine
